# Patient Record
Sex: Male | Born: 1960 | Race: White | Hispanic: No | Marital: Single | Smoking: Never smoker
Health system: Southern US, Community
[De-identification: ages and names within clinical notes are randomized; demographics above are authoritative.]

## PROBLEM LIST (undated history)

## (undated) DIAGNOSIS — E7800 Pure hypercholesterolemia, unspecified: Secondary | ICD-10-CM

## (undated) DIAGNOSIS — I639 Cerebral infarction, unspecified: Secondary | ICD-10-CM

## (undated) DIAGNOSIS — E119 Type 2 diabetes mellitus without complications: Secondary | ICD-10-CM

## (undated) HISTORY — DX: Cerebral infarction, unspecified: I63.9

---

## 2009-04-14 ENCOUNTER — Ambulatory Visit: Payer: Self-pay | Admitting: Unknown Physician Specialty

## 2018-08-02 ENCOUNTER — Encounter (HOSPITAL_COMMUNITY): Payer: Self-pay | Admitting: Emergency Medicine

## 2018-08-02 ENCOUNTER — Other Ambulatory Visit: Payer: Self-pay

## 2018-08-02 ENCOUNTER — Emergency Department (HOSPITAL_COMMUNITY): Payer: Self-pay

## 2018-08-02 ENCOUNTER — Inpatient Hospital Stay (HOSPITAL_COMMUNITY)
Admission: EM | Admit: 2018-08-02 | Discharge: 2018-08-06 | DRG: 065 | Disposition: A | Discharge status: Home / Self Care | Payer: Self-pay | Attending: Family Medicine | Admitting: Family Medicine

## 2018-08-02 ENCOUNTER — Observation Stay (HOSPITAL_COMMUNITY): Payer: Self-pay

## 2018-08-02 DIAGNOSIS — I639 Cerebral infarction, unspecified: Secondary | ICD-10-CM

## 2018-08-02 DIAGNOSIS — Z794 Long term (current) use of insulin: Secondary | ICD-10-CM

## 2018-08-02 DIAGNOSIS — R29702 NIHSS score 2: Secondary | ICD-10-CM | POA: Diagnosis present

## 2018-08-02 DIAGNOSIS — E1165 Type 2 diabetes mellitus with hyperglycemia: Secondary | ICD-10-CM | POA: Diagnosis present

## 2018-08-02 DIAGNOSIS — R29810 Facial weakness: Secondary | ICD-10-CM | POA: Diagnosis present

## 2018-08-02 DIAGNOSIS — Z79899 Other long term (current) drug therapy: Secondary | ICD-10-CM

## 2018-08-02 DIAGNOSIS — G8191 Hemiplegia, unspecified affecting right dominant side: Secondary | ICD-10-CM | POA: Diagnosis present

## 2018-08-02 DIAGNOSIS — G6289 Other specified polyneuropathies: Secondary | ICD-10-CM

## 2018-08-02 DIAGNOSIS — I5189 Other ill-defined heart diseases: Secondary | ICD-10-CM

## 2018-08-02 DIAGNOSIS — R739 Hyperglycemia, unspecified: Secondary | ICD-10-CM

## 2018-08-02 DIAGNOSIS — E1142 Type 2 diabetes mellitus with diabetic polyneuropathy: Secondary | ICD-10-CM | POA: Diagnosis present

## 2018-08-02 DIAGNOSIS — E119 Type 2 diabetes mellitus without complications: Secondary | ICD-10-CM

## 2018-08-02 DIAGNOSIS — I10 Essential (primary) hypertension: Secondary | ICD-10-CM | POA: Diagnosis present

## 2018-08-02 DIAGNOSIS — Z23 Encounter for immunization: Secondary | ICD-10-CM

## 2018-08-02 DIAGNOSIS — E785 Hyperlipidemia, unspecified: Secondary | ICD-10-CM | POA: Diagnosis present

## 2018-08-02 DIAGNOSIS — E781 Pure hyperglyceridemia: Secondary | ICD-10-CM | POA: Diagnosis present

## 2018-08-02 DIAGNOSIS — I63312 Cerebral infarction due to thrombosis of left middle cerebral artery: Secondary | ICD-10-CM

## 2018-08-02 DIAGNOSIS — E7800 Pure hypercholesterolemia, unspecified: Secondary | ICD-10-CM | POA: Diagnosis present

## 2018-08-02 HISTORY — DX: Type 2 diabetes mellitus without complications: E11.9

## 2018-08-02 HISTORY — DX: Pure hypercholesterolemia, unspecified: E78.00

## 2018-08-02 LAB — COMPREHENSIVE METABOLIC PANEL
ALK PHOS: 96 U/L (ref 38–126)
ALT: 40 U/L (ref 0–44)
ANION GAP: 13 (ref 5–15)
AST: 23 U/L (ref 15–41)
Albumin: 3.9 g/dL (ref 3.5–5.0)
BUN: 28 mg/dL — ABNORMAL HIGH (ref 6–20)
CALCIUM: 9.8 mg/dL (ref 8.9–10.3)
CO2: 23 mmol/L (ref 22–32)
CREATININE: 0.84 mg/dL (ref 0.61–1.24)
Chloride: 95 mmol/L — ABNORMAL LOW (ref 98–111)
Glucose, Bld: 440 mg/dL — ABNORMAL HIGH (ref 70–99)
Potassium: 4.2 mmol/L (ref 3.5–5.1)
SODIUM: 131 mmol/L — AB (ref 135–145)
Total Bilirubin: 2 mg/dL — ABNORMAL HIGH (ref 0.3–1.2)
Total Protein: 6.9 g/dL (ref 6.5–8.1)

## 2018-08-02 LAB — GLUCOSE, CAPILLARY
GLUCOSE-CAPILLARY: 247 mg/dL — AB (ref 70–99)
GLUCOSE-CAPILLARY: 306 mg/dL — AB (ref 70–99)

## 2018-08-02 LAB — DIFFERENTIAL
Abs Immature Granulocytes: 0.1 10*3/uL (ref 0.0–0.1)
BASOS ABS: 0.1 10*3/uL (ref 0.0–0.1)
Basophils Relative: 1 %
EOS PCT: 2 %
Eosinophils Absolute: 0.2 10*3/uL (ref 0.0–0.7)
Immature Granulocytes: 1 %
LYMPHS PCT: 27 %
Lymphs Abs: 2.3 10*3/uL (ref 0.7–4.0)
MONO ABS: 0.6 10*3/uL (ref 0.1–1.0)
MONOS PCT: 7 %
NEUTROS ABS: 5.4 10*3/uL (ref 1.7–7.7)
Neutrophils Relative %: 62 %

## 2018-08-02 LAB — RAPID URINE DRUG SCREEN, HOSP PERFORMED
Amphetamines: NOT DETECTED
Barbiturates: NOT DETECTED
Benzodiazepines: NOT DETECTED
Cocaine: NOT DETECTED
OPIATES: NOT DETECTED
Tetrahydrocannabinol: NOT DETECTED

## 2018-08-02 LAB — PROTIME-INR
INR: 0.89
Prothrombin Time: 12 seconds (ref 11.4–15.2)

## 2018-08-02 LAB — I-STAT CHEM 8, ED
BUN: 32 mg/dL — ABNORMAL HIGH (ref 6–20)
CHLORIDE: 99 mmol/L (ref 98–111)
CREATININE: 0.8 mg/dL (ref 0.61–1.24)
Calcium, Ion: 1.16 mmol/L (ref 1.15–1.40)
GLUCOSE: 457 mg/dL — AB (ref 70–99)
HCT: 45 % (ref 39.0–52.0)
Hemoglobin: 15.3 g/dL (ref 13.0–17.0)
POTASSIUM: 4.2 mmol/L (ref 3.5–5.1)
Sodium: 130 mmol/L — ABNORMAL LOW (ref 135–145)
TCO2: 24 mmol/L (ref 22–32)

## 2018-08-02 LAB — I-STAT TROPONIN, ED: TROPONIN I, POC: 0.01 ng/mL (ref 0.00–0.08)

## 2018-08-02 LAB — APTT: APTT: 31 s (ref 24–36)

## 2018-08-02 LAB — URINALYSIS, ROUTINE W REFLEX MICROSCOPIC
BILIRUBIN URINE: NEGATIVE
Glucose, UA: >=500 mg/dL — AB
KETONES UR: 20 mg/dL — AB
LEUKOCYTES UA: NEGATIVE
NITRITE: NEGATIVE
PH: 6 (ref 5.0–8.0)
Protein, ur: 30 mg/dL — AB
Specific Gravity, Urine: 1.03 (ref 1.005–1.030)

## 2018-08-02 LAB — CBG MONITORING, ED: GLUCOSE-CAPILLARY: 439 mg/dL — AB (ref 70–99)

## 2018-08-02 LAB — CBC
HEMATOCRIT: 45.3 % (ref 39.0–52.0)
HEMOGLOBIN: 15.6 g/dL (ref 13.0–17.0)
MCH: 30.4 pg (ref 26.0–34.0)
MCHC: 34.4 g/dL (ref 30.0–36.0)
MCV: 88.1 fL (ref 78.0–100.0)
Platelets: 211 10*3/uL (ref 150–400)
RBC: 5.14 MIL/uL (ref 4.22–5.81)
RDW: 12.6 % (ref 11.5–15.5)
WBC: 8.6 10*3/uL (ref 4.0–10.5)

## 2018-08-02 LAB — ETHANOL: Alcohol, Ethyl (B): <10 mg/dL (ref <10)

## 2018-08-02 MED ORDER — ACETAMINOPHEN 160 MG/5ML PO SOLN: 650.0000 mg | ORAL | Status: DC | PRN

## 2018-08-02 MED ORDER — INSULIN ASPART 100 UNIT/ML ~~LOC~~ SOLN
0.0000 [IU] | Freq: Every day | SUBCUTANEOUS | Status: DC
  Administered 2018-08-02: 4 [IU] via SUBCUTANEOUS
  Administered 2018-08-03: 3 [IU] via SUBCUTANEOUS
  Administered 2018-08-05: 4 [IU] via SUBCUTANEOUS

## 2018-08-02 MED ORDER — ENOXAPARIN SODIUM 40 MG/0.4ML ~~LOC~~ SOLN
40.0000 mg | SUBCUTANEOUS | Status: DC
  Administered 2018-08-02 – 2018-08-05 (×4): 40 mg via SUBCUTANEOUS
  Filled 2018-08-02 (×4): qty 0.4

## 2018-08-02 MED ORDER — ASPIRIN 325 MG PO TABS
325.0000 mg | ORAL_TABLET | Freq: Every day | ORAL | Status: DC
  Administered 2018-08-02: 325 mg via ORAL
  Filled 2018-08-02: qty 1

## 2018-08-02 MED ORDER — INSULIN ASPART 100 UNIT/ML ~~LOC~~ SOLN
10.0000 [IU] | Freq: Once | SUBCUTANEOUS | Status: AC
  Administered 2018-08-02: 10 [IU] via SUBCUTANEOUS
  Filled 2018-08-02: qty 1

## 2018-08-02 MED ORDER — INSULIN GLARGINE 100 UNIT/ML ~~LOC~~ SOLN
10.0000 [IU] | Freq: Every day | SUBCUTANEOUS | Status: DC
  Administered 2018-08-02: 10 [IU] via SUBCUTANEOUS
  Filled 2018-08-02: qty 0.1

## 2018-08-02 MED ORDER — ACETAMINOPHEN 650 MG RE SUPP: 650.0000 mg | RECTAL | Status: DC | PRN

## 2018-08-02 MED ORDER — INSULIN ASPART 100 UNIT/ML ~~LOC~~ SOLN
0.0000 [IU] | Freq: Three times a day (TID) | SUBCUTANEOUS | Status: DC
  Administered 2018-08-02: 3 [IU] via SUBCUTANEOUS

## 2018-08-02 MED ORDER — STROKE: EARLY STAGES OF RECOVERY BOOK
Freq: Once | Status: AC
  Administered 2018-08-02: 19:00:00

## 2018-08-02 MED ORDER — IOPAMIDOL (ISOVUE-370) INJECTION 76%
INTRAVENOUS | Status: AC
  Filled 2018-08-02: qty 50

## 2018-08-02 MED ORDER — ATORVASTATIN CALCIUM 40 MG PO TABS
40.0000 mg | ORAL_TABLET | Freq: Every day | ORAL | Status: DC
  Administered 2018-08-02: 40 mg via ORAL
  Filled 2018-08-02: qty 1

## 2018-08-02 MED ORDER — INSULIN ASPART 100 UNIT/ML ~~LOC~~ SOLN
3.0000 [IU] | Freq: Three times a day (TID) | SUBCUTANEOUS | Status: DC
  Administered 2018-08-03 (×2): 3 [IU] via SUBCUTANEOUS

## 2018-08-02 MED ORDER — SENNOSIDES-DOCUSATE SODIUM 8.6-50 MG PO TABS: 1.0000 | ORAL_TABLET | Freq: Every evening | ORAL | Status: DC | PRN

## 2018-08-02 MED ORDER — CLOPIDOGREL BISULFATE 300 MG PO TABS
300.0000 mg | ORAL_TABLET | Freq: Once | ORAL | Status: AC
  Administered 2018-08-02: 300 mg via ORAL
  Filled 2018-08-02: qty 1

## 2018-08-02 MED ORDER — CLOPIDOGREL BISULFATE 75 MG PO TABS
75.0000 mg | ORAL_TABLET | Freq: Every day | ORAL | Status: DC
  Administered 2018-08-03 – 2018-08-06 (×4): 75 mg via ORAL
  Filled 2018-08-02 (×4): qty 1

## 2018-08-02 MED ORDER — ASPIRIN EC 81 MG PO TBEC
81.0000 mg | DELAYED_RELEASE_TABLET | Freq: Every day | ORAL | Status: DC
  Administered 2018-08-03 – 2018-08-06 (×4): 81 mg via ORAL
  Filled 2018-08-02 (×4): qty 1

## 2018-08-02 MED ORDER — IOPAMIDOL (ISOVUE-370) INJECTION 76%
100.0000 mL | Freq: Once | INTRAVENOUS | Status: AC | PRN
  Administered 2018-08-02: 100 mL via INTRAVENOUS

## 2018-08-02 MED ORDER — INSULIN ASPART 100 UNIT/ML ~~LOC~~ SOLN
0.0000 [IU] | Freq: Three times a day (TID) | SUBCUTANEOUS | Status: DC
  Administered 2018-08-03 (×2): 7 [IU] via SUBCUTANEOUS
  Administered 2018-08-03: 5 [IU] via SUBCUTANEOUS
  Administered 2018-08-04 (×2): 7 [IU] via SUBCUTANEOUS
  Administered 2018-08-04: 9 [IU] via SUBCUTANEOUS
  Administered 2018-08-05: 2 [IU] via SUBCUTANEOUS
  Administered 2018-08-05: 3 [IU] via SUBCUTANEOUS
  Administered 2018-08-05: 5 [IU] via SUBCUTANEOUS
  Administered 2018-08-06: 2 [IU] via SUBCUTANEOUS
  Administered 2018-08-06 (×2): 3 [IU] via SUBCUTANEOUS

## 2018-08-02 MED ORDER — HYDRALAZINE HCL 20 MG/ML IJ SOLN: 5.0000 mg | Freq: Four times a day (QID) | INTRAMUSCULAR | Status: DC | PRN

## 2018-08-02 MED ORDER — ACETAMINOPHEN 325 MG PO TABS: 650.0000 mg | ORAL_TABLET | ORAL | Status: DC | PRN

## 2018-08-02 NOTE — ED Triage Notes (Signed)
EMS- with patient from work with c/o difficulty picking up items with right hand and slurred speech this morning when he woke up around 0500. He reported to work around 0700 others noticed his changes. EMS was called at 11:49. Symptoms began to resolve with ems. Stroke team at the bridge.

## 2018-08-02 NOTE — H&P (Addendum)
Family Medicine Teaching [facility] Admission History and Physical Service Pager: [phone]  Patient name: Victor Moreno Medical record number: [id number] Date of birth: Jun 27, 1961 Age: 57 y.o. Gender: male  Primary Care Provider: [facility], Dr. Lacie Scotts Consultants: Neurology Code Status: FULL  Chief Complaint: Slurred speech  Assessment and Plan: Victor Moreno is a right-handed 57 y.o. male presenting with slurred speech, right-sided weakness. PMH is significant for T2DM, HLD.  Patient denies any other medical problems.  His primary care office has been contacted and they are to fax over his recent medical records for med rec and confirmation of problem list.  These papers will be faxed to the nurses station on 3 W.  CVA Patient does not have any gross neurological deficit on exam on floor with the exception of left hand rapid movement, which he reports is likely baseline for him, as he is right handed.   Admit to telemetry, attending Dr. Leveda Anna  Neurology recommendations for stroke protocol  Start dual antiplatelet therapy with Plavix 75 mg, 325 mg aspirin, s/p plavix 300mg  in ED  A.m. lipid panel, and an A1c  Neuro checks q4  ECHO  Carotid dopplers  PT/OT/SLP to eval and treat  Regular diet as patient passed swallow study  MRI and MRA head and neck pending  Hypertension Patient has never been diagnosed with hypertension.  However while emergency room, his systolic is 201 but is currently otherwise asymptomatic. 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   We will continue to monitor blood pressures and add medication if needed.Marland Kitchen  T2DM:  Takes 10-15U insulin in the morning, metformin and another medication that the patient cannot remember, (maybe Lazi?). Takes his medications most mornings, misses maybe 2 days per week. Does not measure blood sugar at home.    Patient received 10 units of NovoLog in the ED for elevated blood glucose.  We  will place patient on sensitive sliding scale 3 times daily with meals.   Will approximate insulin needs with tomorrow's glucose values if records do not come through.  HLD: Takes pravastatin 40mg  daily.   Will start Lipitor 40 mg patient  Peripheral Neuropathy: Since current disease is in his feet.  He states that when he gets bad it feels like he is wearing shoes 2 sizes too small.  Today it is stable in physical exam.    Patient does not take any medication for peripheral neuropathy.   F: none E: replete PRN N: PO  Regular diet  Prophylaxis: Lovenox  Disposition: Pending patient's acute clinical recovery  History of Present Illness:  Victor Moreno is a 57 y.o. male presenting with stroke-like symptoms.  Patient woke up at 5 am and lost his balance when getting out of bed. That is not unusual for him because he has neuropathy. Before getting into work, he was writing a check and found it hard to write in cursive, however was able to complete writing the check. While driving to work, he noticed no differences. He arrived to work around 7 am and typically works by himself in the mornings.  It was not until 1015 that coworkers reported to him that he was slurring his speech.  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  He has never had a stroke before.He did not take any of his medications this morning.  Mom and brother are at bedside.  He arrived by EMS at 1236 and on arrival his NIH  score was 2.  He had no extremity weakness on arrival.  He received CT angios head and neck and a CT noncontrast.  His sugar was 440 and was given 10 units of insulin and 300 mg Plavix.  Neurology was consulted and they requested MRIs.  Review Of Systems: Per HPI with the following additions:   Review of Systems  Constitutional: Negative for chills and fever.  HENT: Negative for congestion.   Eyes: Negative for blurred vision and double vision.  Respiratory:  Negative for shortness of breath.   Cardiovascular: Negative for chest pain.  Gastrointestinal: Negative for constipation, diarrhea, nausea and vomiting.  Genitourinary: Negative for dysuria.  Musculoskeletal: Negative for falls.  Neurological: Positive for speech change, focal weakness and weakness. Negative for headaches.    Patient Active Problem List   Diagnosis Date Noted  . CVA (cerebral vascular accident) (HCC) 08/02/2018    Past Medical History: Past Medical History:  Diagnosis Date  . Diabetes mellitus without complication (HCC)   . 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  Past Surgical History: History reviewed. No pertinent surgical history.  Social History: Social History   Tobacco Use  . Smoking status: Never Smoker  . Smokeless tobacco: Never Used  Substance Use Topics  . Alcohol use: Not Currently  . Drug use: Never  Additional social history: He is worked at [employer] for the last 2 months.  He lives alone at home.  In his return he watches TV, does yard work, enjoys Museum/gallery curator music, and working on his computer.  Never smoker, only drinks a few beers a year, does not use illicit drugs   Family History: Mother had high cholesterol, hypothyroidism Brother with hypertension, HLD and hypothyroidism Dad passed away at your 35 with prostate cancer.  Died of complications from bleeding.  Allergies and Medications: Allergies  Allergen Reactions  . Bee Venom Anaphylaxis   No current facility-administered medications on file prior to encounter.    No current outpatient medications on file prior to encounter.    Objective: BP (!) 147/82   Pulse 77   Temp 97.9 F (36.6 C)   Resp 20   Ht 5' 9.25" (1.759 m)   Wt 86.2 kg   SpO2 96%   BMI 27.86 kg/m  Exam: Gen: NAD, alert, non-toxic, well-nourished, well-appearing, pleasant. No facial droop appreciated. HEENT: Normocephaic, atraumatic. PERRLA, clear conjuctiva, no  scleral icterus and injection. Normal EOM.  Hearing intact.  Neck supple with no LAD, nodules, or gross abnormality.  Nares patent with no discharge.    Oropharynx without erythema and lesions.  Tonsils nonswollen and without exudate.   CV: 2/6 diastolic murmur.  Normal S1-S2.  No murmur, gallops, S3, S4 appreciated.  Normal capillary refill bilaterally.  Radial pulses 2+ bilaterally. No bilateral lower extremity edema. Resp: Clear to auscultation bilaterally.  No wheezing, rales, rhonchi, or other abnormal lung sounds.  No increased work of breathing appreciated. Abd: Nontender and nondistended on palpation to all 4 quadrants.  Positive bowel sounds. Skin: No obvious rashes, lesions, or trauma.  Normal turgor.  MSK: Normal ROM. Normal strength and tone.  Extremities: Right 1st toenail is black with ridged toenail. No sings of infection. Remainder to feet appear normal bilaterally. Psych: Cooperative with exam.  Normal speech. Pleasant. Makes good eye contact. Neurologic examination: Level of Consciousness: Awake Mental status:  Cognition:The patient is alert, attentive, and oriented x4.  Speech is clear and fluent with good repetition, comprehension,  and naming.  Cranial nerves: CN II: Visual fields are full to confrontation.  CN III, IV, VI: At primary gaze, there is no eye deviation. No ptosis CN V: Facial sensation is intact to pinprick in all 3 divisions bilaterally. Corneal responses are intact. CN VII: Face is symmetric with normal eye closure and smile. CN VII: Hearing is normal to rubbing fingers CN IX, X: Palate elevates symmetrically.  CN XI: Head turning and shoulder shrug are intact CN XII: Tongue is midline with normal movements and no atrophy. Motor: There is no pronator drift of out-stretched arms. Muscle bulk and tone are normal. Strength is 5/5 in all major muscle groups bilaterally. Deltoid, biceps, triceps, wrist extension, finger abduction, hip flexion, hip extension,  knee flexion, knee extension, ankle flexion, ankle extension. Sensory: Grossly intact with the exception of bilateral feet.  Coordination: Rapid alternating movements intact; however left less so than right. There is no dysmetria on  heel-knee-shin. There are no abnormal or extraneous movements.  Gait/Stance: No assessed.  Labs and Imaging: CBC BMET  Recent Labs  Lab 08/02/18 1241 08/02/18 1243  WBC 8.6  --   HGB 15.6 15.3  HCT 45.3 45.0  PLT 211  --    Recent Labs  Lab 08/02/18 1241 08/02/18 1243  NA 131* 130*  K 4.2 4.2  CL 95* 99  CO2 23  --   BUN 28* 32*  CREATININE 0.84 0.80  GLUCOSE 440* 457*  CALCIUM 9.8  --      PT 12.0, PTT 31, INR 0.89.  T bili 2.0, troponins 0.011.  Remainder of CMP within normal limits.  Melene Plan, MD 08/02/2018, 3:09 PM PGY-1, [facility] Family Medicine FPTS Intern pager: [phone], text pages welcome  FPTS Upper-Level Resident Addendum   I have independently interviewed and examined the patient. I have discussed the above with the original author and agree with their documentation. My edits for correction/addition/clarification are in purple. Please see also any attending notes.    [place] Chrystopher Stangl, DO PGY-2, [facility] Family Medicine 08/02/2018 7:44 PM  FPTS Service pager: [phone] (text pages welcome through [facility])

## 2018-08-02 NOTE — ED Provider Notes (Signed)
[name] EMERGENCY DEPARTMENT Provider Note   CSN: [id number] Arrival date & time: 08/02/18  1236   An emergency department physician performed an initial assessment on this suspected stroke patient at 1243(Kyndal Gloster).  History   Chief Complaint Chief Complaint  Patient presents with  . Code Stroke    HPI Victor Moreno is a 57 y.o. male hx of DM, HL, here with possible stroke.  Patient states that he woke up around 5 AM was feeling fine.  He went to work around 7 AM and he noticed slurred speech as well as right arm weakness.  He states that he has trouble picking up things with the right arm.  Code stroke was activated by EMS.  Patient states that his symptoms has begun improving. No previous hx of stroke. He has hx of DM and is on oral meds.   The history is provided by the patient.    Past Medical History:  Diagnosis Date  . Diabetes mellitus without complication (HCC)   . High cholesterol     Patient Active Problem List   Diagnosis Date Noted  . CVA (cerebral vascular accident) (HCC) 08/02/2018    History reviewed. No pertinent surgical history.      Home Medications    Prior to Admission medications   Not on File    Family History No family history on file.  Social History Social History   Tobacco Use  . Smoking status: Never Smoker  . Smokeless tobacco: Never Used  Substance Use Topics  . Alcohol use: Not Currently  . Drug use: Never     Allergies   Patient has no known allergies.   Review of Systems Review of Systems  Neurological: Positive for speech difficulty and weakness.  All other systems reviewed and are negative.    Physical Exam Updated Vital Signs BP (!) 175/94   Pulse 79   Temp 97.9 F (36.6 C)   Resp 19   Ht 5' 9.25" (1.759 m)   Wt 86.2 kg   SpO2 96%   BMI 27.86 kg/m   Physical Exam  Constitutional: He is oriented to person, place, and time. He appears well-developed.  HENT:  Head: Normocephalic.   Eyes: Pupils are equal, round, and reactive to light. Conjunctivae and EOM are normal.  Neck: Normal range of motion.  Cardiovascular: Normal rate.  Pulmonary/Chest: Effort normal.  Abdominal: Soft.  Musculoskeletal: Normal range of motion.  Neurological: He is alert and oriented to person, place, and time.  Mild slurred speech. Mild R facial droop. Strength 5/5 throughout, no pronator drift   Skin: Skin is warm.  Psychiatric: He has a normal mood and affect.  Nursing note and vitals reviewed.    ED Treatments / Results  Labs (all labs ordered are listed, but only abnormal results are displayed) Labs Reviewed  COMPREHENSIVE METABOLIC PANEL - Abnormal; Notable for the following components:      Result Value   Sodium 131 (*)    Chloride 95 (*)    Glucose, Bld 440 (*)    BUN 28 (*)    Total Bilirubin 2.0 (*)    All other components within normal limits  CBG MONITORING, ED - Abnormal; Notable for the following components:   Glucose-Capillary 439 (*)    All other components within normal limits  I-STAT CHEM 8, ED - Abnormal; Notable for the following components:   Sodium 130 (*)    BUN 32 (*)    Glucose, Bld 457 (*)  All other components within normal limits  ETHANOL  PROTIME-INR  APTT  CBC  DIFFERENTIAL  RAPID URINE DRUG SCREEN, HOSP PERFORMED  URINALYSIS, ROUTINE W REFLEX MICROSCOPIC  I-STAT TROPONIN, ED    EKG None  Radiology Ct Angio Head W Or Wo Contrast  Result Date: 08/02/2018 CLINICAL DATA:  Right-sided weakness, slurred speech EXAM: CT ANGIOGRAPHY HEAD AND NECK TECHNIQUE: Multidetector CT imaging of the head and neck was performed using the standard protocol during bolus administration of intravenous contrast. Multiplanar CT image reconstructions and MIPs were obtained to evaluate the vascular anatomy. Carotid stenosis measurements (when applicable) are obtained utilizing NASCET criteria, using the distal internal carotid diameter as the denominator. CONTRAST:   ISOVUE-370 IOPAMIDOL (ISOVUE-370) INJECTION 76% COMPARISON:  CT head 08/02/2018 FINDINGS: CTA NECK FINDINGS Aortic arch: Standard branching. Imaged portion shows no evidence of aneurysm or dissection. No significant stenosis of the major arch vessel origins. Right carotid system: Mild atherosclerotic disease at the right carotid bifurcation without significant stenosis. Left carotid system: Atherosclerotic disease left carotid bifurcation and proximal left internal carotid artery with minimal luminal diameter 3.5 mm, similar to the normal diameter without significant stenosis. Vertebral arteries: Moderate stenosis origin of the vertebral artery bilaterally. Both vertebral arteries are patent to the basilar. Skeleton: Cervical spondylosis.  No acute bony abnormality. Other neck: Negative for mass or adenopathy. Upper chest: Negative Review of the MIP images confirms the above findings CTA HEAD FINDINGS Anterior circulation: Mild atherosclerotic calcification in the cavernous carotid bilaterally without stenosis. Proximal anterior and middle cerebral arteries widely patent bilaterally. Mild to moderate stenosis distal anterior cerebral arteries bilaterally. No significant middle cerebral artery stenosis. Posterior circulation: Mild calcific stenosis left vertebral artery at the level of the dura. Right vertebral artery widely patent. Both vertebral arteries contribute to the basilar. PICA patent bilaterally. Superior cerebellar and posterior cerebral arteries patent. Moderate stenosis P2 segment bilaterally. Venous sinuses: Patent Anatomic variants: None Delayed phase: Not performed Review of the MIP images confirms the above findings IMPRESSION: 1. Negative for emergent large vessel occlusion 2. Moderate stenosis origin of the vertebral arteries bilaterally. Atherosclerotic disease of the carotid bifurcation left greater than right without hemodynamically significant stenosis 3. Mild to moderate stenosis  distal ACA bilaterally. Moderate stenosis P2 segment bilaterally. Electronically Signed   By: Marlan Palau M.D.   On: 08/02/2018 13:16   Ct Angio Neck W Or Wo Contrast  Result Date: 08/02/2018 CLINICAL DATA:  Right-sided weakness, slurred speech EXAM: CT ANGIOGRAPHY HEAD AND NECK TECHNIQUE: Multidetector CT imaging of the head and neck was performed using the standard protocol during bolus administration of intravenous contrast. Multiplanar CT image reconstructions and MIPs were obtained to evaluate the vascular anatomy. Carotid stenosis measurements (when applicable) are obtained utilizing NASCET criteria, using the distal internal carotid diameter as the denominator. CONTRAST:  ISOVUE-370 IOPAMIDOL (ISOVUE-370) INJECTION 76% COMPARISON:  CT head 08/02/2018 FINDINGS: CTA NECK FINDINGS Aortic arch: Standard branching. Imaged portion shows no evidence of aneurysm or dissection. No significant stenosis of the major arch vessel origins. Right carotid system: Mild atherosclerotic disease at the right carotid bifurcation without significant stenosis. Left carotid system: Atherosclerotic disease left carotid bifurcation and proximal left internal carotid artery with minimal luminal diameter 3.5 mm, similar to the normal diameter without significant stenosis. Vertebral arteries: Moderate stenosis origin of the vertebral artery bilaterally. Both vertebral arteries are patent to the basilar. Skeleton: Cervical spondylosis.  No acute bony abnormality. Other neck: Negative for mass or adenopathy. Upper chest: Negative Review of  the MIP images confirms the above findings CTA HEAD FINDINGS Anterior circulation: Mild atherosclerotic calcification in the cavernous carotid bilaterally without stenosis. Proximal anterior and middle cerebral arteries widely patent bilaterally. Mild to moderate stenosis distal anterior cerebral arteries bilaterally. No significant middle cerebral artery stenosis. Posterior circulation: Mild  calcific stenosis left vertebral artery at the level of the dura. Right vertebral artery widely patent. Both vertebral arteries contribute to the basilar. PICA patent bilaterally. Superior cerebellar and posterior cerebral arteries patent. Moderate stenosis P2 segment bilaterally. Venous sinuses: Patent Anatomic variants: None Delayed phase: Not performed Review of the MIP images confirms the above findings IMPRESSION: 1. Negative for emergent large vessel occlusion 2. Moderate stenosis origin of the vertebral arteries bilaterally. Atherosclerotic disease of the carotid bifurcation left greater than right without hemodynamically significant stenosis 3. Mild to moderate stenosis distal ACA bilaterally. Moderate stenosis P2 segment bilaterally. Electronically Signed   By: Marlan Palau M.D.   On: 08/02/2018 13:16   Ct Head Code Stroke Wo Contrast  Result Date: 08/02/2018 CLINICAL DATA:  Code stroke. Speech difficulty. Right-sided weakness. EXAM: CT HEAD WITHOUT CONTRAST TECHNIQUE: Contiguous axial images were obtained from the base of the skull through the vertex without intravenous contrast. COMPARISON:  None. FINDINGS: Brain: Small hypodensity right frontal white matter consistent with chronic ischemia. Negative for acute infarct, hemorrhage, mass. Ventricle size normal. Vascular: Atherosclerotic calcification of the skull base. Negative for hyperdense vessel Skull: Negative Sinuses/Orbits: Paranasal sinuses clear. Cataract surgery on the right. No orbital mass. Other: None ASPECTS (Alberta Stroke Program Early CT Score) - Ganglionic level infarction (caudate, lentiform nuclei, internal capsule, insula, M1-M3 cortex): 7 - Supraganglionic infarction (M4-M6 cortex): 3 Total score (0-10 with 10 being normal): 10 IMPRESSION: 1. No acute abnormality 2. ASPECTS is 10 3. These results were called by telephone at the time of interpretation on 08/02/2018 at 12:55 pm to Dr. Amada Jupiter, who verbally acknowledged these  results. Electronically Signed   By: Marlan Palau M.D.   On: 08/02/2018 12:55    Procedures Procedures (including critical care time)  Medications Ordered in ED Medications  iopamidol (ISOVUE-370) 76 % injection (has no administration in time range)  clopidogrel (PLAVIX) tablet 75 mg (has no administration in time range)  insulin aspart (novoLOG) injection 10 Units (10 Units Subcutaneous Given 08/02/18 1312)  iopamidol (ISOVUE-370) 76 % injection 100 mL (100 mLs Intravenous Contrast Given 08/02/18 1300)  clopidogrel (PLAVIX) tablet 300 mg (300 mg Oral Given 08/02/18 1407)     Initial Impression / Assessment and Plan / ED Course  I have reviewed the triage vital signs and the nursing notes.  Pertinent labs & imaging results that were available during my care of the patient were reviewed by me and considered in my medical decision making (see chart for details).    Victor Moreno is a 57 y.o. male here with code stroke. Last normal was 7 am. Code stroke activated by EMS. Dr. Amada Jupiter at bedside. NIH was 2 on initial assessment. No TPA currently. Plan to get labs, CT head, CTA head/neck   2:17 PM CTA showed no obvious LVO. Labs showed glucose 450. Given 10 U SQ insulin. He follows up at [facility] practice. Family practice to admit for stroke workup, uncontrolled diabetes.    Final Clinical Impressions(s) / ED Diagnoses   Final diagnoses:  None    ED Discharge Orders    None       Charlynne Pander, MD 08/02/18 [phone]

## 2018-08-02 NOTE — Consult Note (Signed)
Neurology Consultation Reason for Consult: Right-sided weakness Referring Physician: Silverio Lay, D  CC: Right-sided weakness  History is obtained from: Patient  HPI: Victor Moreno is a 57 y.o. male with a history of high cholesterol and diabetes who awoke this morning at 5 AM in his normal state of health.  At 7 AM he began noticing right-sided weakness.  He was having trouble picking things up, and was dropping things with his right hand.  Then people began noticing his symptoms, and around noon EMS was finally called.   En route, his symptoms began improving, but he continued to have mild right-sided weakness even after arrival.   LKW: 7 AM tpa given?: no, outside of window Premorbid modified rankin scale: 0 NIHSS: 2   ROS: A 14 point ROS was performed and is negative except as noted in the HPI.   Past Medical History:  Diagnosis Date  . Diabetes mellitus without complication (HCC)   . High cholesterol      Family history: Grandfather-stroke   Social History: Denies tobacco, alcohol or drug use  Exam: Current vital signs: SpO2 90%  Vital signs in last 24 hours: SpO2:  [90 %] 90 % (09/06 1240)   Physical Exam  Constitutional: Appears well-developed and well-nourished.  Psych: Affect appropriate to situation Eyes: No scleral injection HENT: No OP obstrucion Head: Normocephalic.  Cardiovascular: Normal rate and regular rhythm.  Respiratory: Effort normal, non-labored breathing GI: Soft.  No distension. There is no tenderness.  Skin: WDI  Neuro: Mental Status: Patient is awake, alert, oriented to person, place, month, year, and situation. Patient is able to give a clear and coherent history. No signs of aphasia or neglect Cranial Nerves: II: Visual Fields are full. Pupils are equal, round, and reactive to light.   III,IV, VI: EOMI without ptosis or diploplia.  V: Facial sensation is symmetric to temperature VII: Facial movement is symmetric.  VIII: hearing is  intact to voice X: Uvula elevates symmetrically XI: Shoulder shrug is symmetric. XII: tongue is midline without atrophy or fasciculations.  Motor: Tone is normal. Bulk is normal. 5/5 strength was present on the left side, he has mild 4+/5 weakness of the right arm and leg without drift Sensory: Sensation is symmetric to light touch and temperature in the arms and legs. Cerebellar: He has mild difficulty in finger-nose-finger and heel-knee-shin on the right which I suspect is consistent with his weakness as opposed to a very mild ataxia, normal in the left     I have reviewed labs in epic and the results pertinent to this consultation are: Elevated glucose  I have reviewed the images obtained: CT/CTA-no LVO, no clear acute findings  Impression: 57 year old male with acute onset right-sided weakness consistent with a small subcortical infarct.  I suspect that this is due to small vessel disease due to his high cholesterol and diabetes.  He will need to be admitted for physical therapy and secondary risk factor modification.  Recommendations: - HgbA1c, fasting lipid panel - MRI, MRA  of the brain without contrast - Frequent neuro checks - Echocardiogram - Carotid dopplers - Prophylactic therapy-Antiplatelet med: Aspirin - dose 325mg  PO or 300mg  PR and Plavix 75 mg daily following 300 mg - Risk factor modification - Telemetry monitoring - PT consult, OT consult, Speech consult - Stroke team to follow  Ritta Slot, MD [facility] [phone]  If 7pm- 7am, please page neurology on call as listed in AMION.

## 2018-08-02 NOTE — Evaluation (Signed)
Physical Therapy Evaluation Patient Details Name: Victor Moreno MRN: [id number] DOB: November 30, 1960 Today's Date: 08/02/2018   History of Present Illness  Victor Moreno is a right-handed 57 y.o. male presenting with slurred speech, right-sided weakness. PMH is significant for T2DM, HLD. MRI pending at time of PT evaluation.     Clinical Impression  Patient evaluated by Physical Therapy with no further acute PT needs identified. All education has been completed and the patient has no further questions. 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 Upon eval pt overall mobilizing near baseline however reports balance somewhere worse today. Mild R sided deficits noted. Feel he would benefit from OP neuro PT for higher level balance training and fine motor skills of R hand. Ambulating unit without assistive device, DGI score 17/24.  See below for any follow-up Physical Therapy or equipment needs. PT is signing off. Thank you for this referral.      Follow Up Recommendations Outpatient PT Outpatient NEURO     Equipment Recommendations  None recommended by PT    Recommendations for Other Services       Precautions / Restrictions        Mobility  Bed Mobility Overal bed mobility: Independent                Transfers Overall transfer level: Independent                  Ambulation/Gait Ambulation/Gait assistance: Modified independent (Device/Increase time) Gait Distance (Feet): 700 Feet Assistive device: None Gait Pattern/deviations: Step-to pattern;Step-through pattern Gait velocity: decrease   General Gait Details: pt with baseline limp however reports it is slightly worse. mild unsteadiness noted but no overt LOB or neED dme   Stairs            Wheelchair Mobility    Modified Rankin (Stroke Patients Only) Modified Rankin (Stroke Patients Only) Pre-Morbid Rankin  Score: No significant disability Modified Rankin: Slight disability     Balance Overall balance assessment: (No need for support but balance deficits noted)                               Standardized Balance Assessment Standardized Balance Assessment : Dynamic Gait Index   Dynamic Gait Index Level Surface: Mild Impairment Change in Gait Speed: Mild Impairment Gait with Horizontal Head Turns: Mild Impairment Gait with Vertical Head Turns: Normal Gait and Pivot Turn: Mild Impairment Step Over Obstacle: Mild Impairment Step Around Obstacles: Mild Impairment Steps: Mild Impairment Total Score: 17       Pertinent Vitals/Pain Pain Assessment: No/denies pain    Home Living Family/patient expects to be discharged to:: Private residence Living Arrangements: Alone Available Help at Discharge: Family;Available PRN/intermittently(mom and brother)                  Prior Function Level of Independence: Independent(Works, drives)               Hand Dominance        Extremity/Trunk Assessment   Upper Extremity Assessment Upper Extremity Assessment: Defer to OT evaluation    Lower Extremity Assessment Lower Extremity Assessment: Overall WFL for tasks assessed(hx of neuropathy BLE. R sided weakness 4/5 gross. )    Cervical / Trunk Assessment Cervical / Trunk Assessment: Normal  Communication   Communication: No difficulties  Cognition Arousal/Alertness: Awake/alert Behavior During  Therapy: WFL for tasks assessed/performed Overall Cognitive Status: Within Functional Limits for tasks assessed                                        General Comments      Exercises     Assessment/Plan    PT Assessment All further PT needs can be met in the next venue of care  PT Problem List Decreased strength;Decreased range of motion;Decreased coordination       PT Treatment Interventions      PT Goals (Current goals can be found in the  Care Plan section)  Acute Rehab PT Goals Patient Stated Goal: return to live at moms [facility]fre getting back to work PT Goal Formulation: With patient Time For Goal Achievement: 08/16/18 Potential to Achieve Goals: Good    Frequency     Barriers to discharge        Co-evaluation               AM-PAC PT "6 Clicks" Daily Activity  Outcome Measure Difficulty turning over in bed (including adjusting bedclothes, sheets and blankets)?: None Difficulty moving from lying on back to sitting on the side of the bed? : None Difficulty sitting down on and standing up from a chair with arms (e.g., wheelchair, bedside commode, etc,.)?: None Help needed moving to and from a bed to chair (including a wheelchair)?: None Help needed walking in hospital room?: None Help needed climbing 3-5 steps with a railing? : None 6 Click Score: 24    End of Session Equipment Utilized During Treatment: Gait belt Activity Tolerance: Patient tolerated treatment well     PT Visit Diagnosis: Unsteadiness on feet (R26.81)    Time: 1715-1735 PT Time Calculation (min) (ACUTE ONLY): 20 min   Charges:   PT Evaluation $PT Eval Moderate Complexity: 1 Mod          Reinaldo Berber, PT, DPT Acute Rehabilitation Services Pager: [phone] Office: [phone]    Reinaldo Berber 08/02/2018, 6:01 PM

## 2018-08-02 NOTE — Progress Notes (Signed)
Bilateral carotid duplex completed - Preliminary results - 1% to 39% ICA stenosis. Vertebral artery flow is antegrade. [employer], RVS 08/02/2018 4:10 PM

## 2018-08-02 NOTE — Code Documentation (Signed)
57yo male arriving to [facility] via [facility] at 1236. Patient from work where he started noticing RUE weakness and slurred speech around 0700. Patient reportedly woke up at 0500 at his baseline. Patient's coworkers also noted symptoms after his arrival to work and called EMS. EMS activated a code stroke for right hand weakness and slurred speech. Patient noted to be hypertensive at 230/110 per EMS. Stroke team at the bedside on patient arrival. Labs drawn and patient to CT with team. CT completed followed by CTA. NIHSS 2, see documentation for details and code stroke times. Patient with mild right facial droop and mild dysarthria. Patient with mild weakness on the right side on exam, however, no drift. Patient is outside the window for treatment with tPA. No acute stroke treatment at this time. Bedside handoff with ED RN Merry Proud.

## 2018-08-02 NOTE — ED Notes (Signed)
ED Provider at bedside. 

## 2018-08-03 ENCOUNTER — Observation Stay (HOSPITAL_COMMUNITY): Payer: Self-pay

## 2018-08-03 ENCOUNTER — Observation Stay (HOSPITAL_BASED_OUTPATIENT_CLINIC_OR_DEPARTMENT_OTHER): Payer: Self-pay

## 2018-08-03 ENCOUNTER — Other Ambulatory Visit (HOSPITAL_COMMUNITY): Payer: Self-pay

## 2018-08-03 DIAGNOSIS — E782 Mixed hyperlipidemia: Secondary | ICD-10-CM

## 2018-08-03 DIAGNOSIS — E1165 Type 2 diabetes mellitus with hyperglycemia: Secondary | ICD-10-CM

## 2018-08-03 DIAGNOSIS — I361 Nonrheumatic tricuspid (valve) insufficiency: Secondary | ICD-10-CM

## 2018-08-03 LAB — LIPID PANEL
Cholesterol: 372 mg/dL — ABNORMAL HIGH (ref 0–200)
LDL CALC: UNDETERMINED mg/dL (ref 0–99)
TRIGLYCERIDES: 1356 mg/dL — AB (ref <150)
VLDL: UNDETERMINED mg/dL (ref 0–40)

## 2018-08-03 LAB — ECHOCARDIOGRAM COMPLETE
Height: 69.25 in
Weight: 3040 oz

## 2018-08-03 LAB — GLUCOSE, CAPILLARY
GLUCOSE-CAPILLARY: 269 mg/dL — AB (ref 70–99)
GLUCOSE-CAPILLARY: 296 mg/dL — AB (ref 70–99)
GLUCOSE-CAPILLARY: 256 mg/dL — AB (ref 70–99)
Glucose-Capillary: 303 mg/dL — ABNORMAL HIGH (ref 70–99)
Glucose-Capillary: 319 mg/dL — ABNORMAL HIGH (ref 70–99)

## 2018-08-03 LAB — HEMOGLOBIN A1C
Hgb A1c MFr Bld: 12.3 % — ABNORMAL HIGH (ref 4.8–5.6)
Mean Plasma Glucose: 306.31 mg/dL

## 2018-08-03 LAB — HIV ANTIBODY (ROUTINE TESTING W REFLEX): HIV SCREEN 4TH GENERATION: NONREACTIVE

## 2018-08-03 MED ORDER — PNEUMOCOCCAL VAC POLYVALENT 25 MCG/0.5ML IJ INJ
0.5000 mL | INJECTION | INTRAMUSCULAR | Status: AC
  Administered 2018-08-04: 0.5 mL via INTRAMUSCULAR
  Filled 2018-08-03: qty 0.5

## 2018-08-03 MED ORDER — INFLUENZA VAC SPLIT QUAD 0.5 ML IM SUSY
0.5000 mL | PREFILLED_SYRINGE | INTRAMUSCULAR | Status: AC
  Administered 2018-08-04: 0.5 mL via INTRAMUSCULAR
  Filled 2018-08-03: qty 0.5

## 2018-08-03 MED ORDER — FENOFIBRATE 54 MG PO TABS
54.0000 mg | ORAL_TABLET | Freq: Every day | ORAL | Status: DC
  Administered 2018-08-03: 54 mg via ORAL
  Filled 2018-08-03: qty 1

## 2018-08-03 MED ORDER — INSULIN ASPART 100 UNIT/ML ~~LOC~~ SOLN
5.0000 [IU] | Freq: Three times a day (TID) | SUBCUTANEOUS | Status: DC
  Administered 2018-08-03 – 2018-08-04 (×2): 5 [IU] via SUBCUTANEOUS

## 2018-08-03 MED ORDER — INSULIN GLARGINE 100 UNIT/ML ~~LOC~~ SOLN
16.0000 [IU] | Freq: Every day | SUBCUTANEOUS | Status: DC
  Administered 2018-08-03: 16 [IU] via SUBCUTANEOUS
  Filled 2018-08-03: qty 0.16

## 2018-08-03 MED ORDER — FENOFIBRATE 160 MG PO TABS
160.0000 mg | ORAL_TABLET | Freq: Every day | ORAL | Status: DC
  Administered 2018-08-04 – 2018-08-06 (×3): 160 mg via ORAL
  Filled 2018-08-03 (×3): qty 1

## 2018-08-03 MED ORDER — ROSUVASTATIN CALCIUM 20 MG PO TABS
20.0000 mg | ORAL_TABLET | Freq: Every day | ORAL | Status: DC
  Administered 2018-08-03 – 2018-08-06 (×4): 20 mg via ORAL
  Filled 2018-08-03 (×4): qty 1

## 2018-08-03 MED ORDER — INSULIN GLARGINE 100 UNIT/ML ~~LOC~~ SOLN
13.0000 [IU] | Freq: Every day | SUBCUTANEOUS | Status: DC
  Filled 2018-08-03: qty 0.13

## 2018-08-03 MED ORDER — INSULIN GLARGINE 100 UNIT/ML ~~LOC~~ SOLN
3.0000 [IU] | Freq: Once | SUBCUTANEOUS | Status: AC
  Administered 2018-08-03: 3 [IU] via SUBCUTANEOUS
  Filled 2018-08-03: qty 0.03

## 2018-08-03 NOTE — Progress Notes (Signed)
  Echocardiogram 2D Echocardiogram has been performed.  Victor Moreno F 08/03/2018, 12:35 PM

## 2018-08-03 NOTE — Progress Notes (Signed)
STROKE TEAM PROGRESS NOTE   SUBJECTIVE (INTERVAL HISTORY) No family is at the bedside.  He is sitting in chair with right facial droop and right hemiparesis. His A1C 12.3 and his TG 1356. BP 160/90 at bedside.    OBJECTIVE Vitals:   08/03/18 0700 08/03/18 0705 08/03/18 0821 08/03/18 0915  BP:  (!) 151/93 (!) 187/102 (!) 141/85  Pulse:  75    Resp: (!) 21 20    Temp:   98.4 F (36.9 C)   TempSrc:   Oral   SpO2: (!) 87% 94%    Weight:      Height:        CBC:  Recent Labs  Lab 08/02/18 1241 08/02/18 1243  WBC 8.6  --   NEUTROABS 5.4  --   HGB 15.6 15.3  HCT 45.3 45.0  MCV 88.1  --   PLT 211  --     Basic Metabolic Panel:  Recent Labs  Lab 08/02/18 1241 08/02/18 1243  NA 131* 130*  K 4.2 4.2  CL 95* 99  CO2 23  --   GLUCOSE 440* 457*  BUN 28* 32*  CREATININE 0.84 0.80  CALCIUM 9.8  --     Lipid Panel:     Component Value Date/Time   CHOL 372 (H) 08/03/2018 0459   TRIG 1,356 (H) 08/03/2018 0459   HDL NOT REPORTED DUE TO HIGH TRIGLYCERIDES 08/03/2018 0459   CHOLHDL NOT REPORTED DUE TO HIGH TRIGLYCERIDES 08/03/2018 0459   VLDL UNABLE TO CALCULATE IF TRIGLYCERIDE OVER 400 mg/dL 16/08/9603 5409   LDLCALC UNABLE TO CALCULATE IF TRIGLYCERIDE OVER 400 mg/dL 81/19/1478 2956   OZHY8M:  Lab Results  Component Value Date   HGBA1C 12.3 (H) 08/03/2018   Urine Drug Screen:     Component Value Date/Time   LABOPIA NONE DETECTED 08/02/2018 1241   COCAINSCRNUR NONE DETECTED 08/02/2018 1241   LABBENZ NONE DETECTED 08/02/2018 1241   AMPHETMU NONE DETECTED 08/02/2018 1241   THCU NONE DETECTED 08/02/2018 1241   LABBARB NONE DETECTED 08/02/2018 1241    Alcohol Level     Component Value Date/Time   ETH <10 08/02/2018 1241    IMAGING  Ct Angio Head W Or Wo Contrast Ct Angio Neck W Or Wo Contrast 08/02/2018 IMPRESSION:  1. Negative for emergent large vessel occlusion  2. Moderate stenosis origin of the vertebral arteries bilaterally. Atherosclerotic disease of  the carotid bifurcation left greater than right without hemodynamically significant stenosis  3. Mild to moderate stenosis distal ACA bilaterally. Moderate stenosis P2 segment bilaterally.    Ct Head Wo Contrast 08/03/2018 IMPRESSION:  The known infarct in the posterior limb of the left internal capsule seen on recent MRI is more conspicuous on today's CT scan than the August 02, 2018 CT scan. This small infarct appears roughly stable in size compared to the comparison MRI. Chronic white matter changes. No other acute abnormalities identified.    Mr Maxine Glenn Head Wo Contrast 08/02/2018 IMPRESSION:  1. 8 mm focus of acute/early subacute infarction within the left posterior limb of internal capsule. No associated hemorrhage or mass effect.  2. Multiple very small chronic infarctions are present in bilateral frontal periventricular white matter. Mild volume loss of the brain.  3. Patent anterior and posterior intracranial circulation. No large vessel occlusion, aneurysm, or high-grade stenosis.  4. Moderate bilateral P2 stenosis. Bilateral ICA atherosclerosis with mild lumen irregularity, no high-grade stenosis.    Ct Head Code Stroke Wo Contrast 08/02/2018 IMPRESSION:  1. No acute abnormality  2. ASPECTS is 10    Transthoracic Echocardiogram  08/03/2018 Study Conclusions - Left ventricle: The cavity size was normal. There was moderate   concentric hypertrophy. Systolic function was normal. The   estimated ejection fraction was in the range of 60% to 65%. Wall   motion was normal; there were no regional wall motion   abnormalities. Doppler parameters are consistent with abnormal   left ventricular relaxation (grade 1 diastolic dysfunction). - Aortic valve: There was no regurgitation. - Mitral valve: Calcified annulus. Mildly thickened leaflets . - Left atrium: The atrium was normal in size. - Right ventricle: Systolic function was normal. - Tricuspid valve: There was mild regurgitation. -  Pulmonic valve: There was no regurgitation. - Pulmonary arteries: Systolic pressure was within the normal   range. - Inferior vena cava: The vessel was normal in size. - Pericardium, extracardiac: There was no pericardial effusion. Impressions: - No cardiac source of emboli was indentified.   PHYSICAL EXAM Temp:  [97.6 F (36.4 C)-99.1 F (37.3 C)] 99.1 F (37.3 C) (09/07 1505) Pulse Rate:  [69-89] 75 (09/07 0705) Resp:  [17-27] 20 (09/07 0705) BP: (141-191)/(81-102) 146/82 (09/07 1310) SpO2:  [85 %-96 %] 96 % (09/07 1310)  General - Well nourished, well developed, in no apparent distress.  Ophthalmologic - fundi not visualized due to noncooperation.  Cardiovascular - Regular rate and rhythm with no murmur.  Mental Status -  Level of arousal and orientation to time, place, and person were intact. Language including expression, naming, repetition, comprehension was assessed and found intact. Moderate dysarthria Fund of Knowledge was assessed and was intact.  Cranial Nerves II - XII - II - Visual field intact OU. III, IV, VI - Extraocular movements intact. V - Facial sensation intact bilaterally. VII - right facial droop. VIII - Hearing & vestibular intact bilaterally. X - Palate elevates symmetrically. XI - Chin turning & shoulder shrug intact bilaterally. XII - Tongue protrusion intact.  Motor Strength - The patient's strength was normal in LUE and LLE extremities however, RUE 3-/5 proximal and 0/5 distally, RLE 4+/5 proximal and 3+/5 distally.  Bulk was normal and fasciculations were absent.   Motor Tone - Muscle tone was assessed at the neck and appendages and was normal.  Reflexes - The patient's reflexes were symmetrical in all extremities and he had no pathological reflexes.  Sensory - Light touch, temperature/pinprick were assessed and were symmetrical.    Coordination - The patient had normal movements in the left hand with no ataxia or dysmetria.  Tremor was  absent.  Gait and Station - deferred.     ASSESSMENT/PLAN [name] is a 57 y.o. male with history of DM and Hld presenting with right sided weakness. He did not receive IV t-PA due to late presentation.  Stroke: left posterior limb of internal capsule likely small vessel disease given uncontrolled risk factors.  Resultant  Right facial droop, right hemiparesis  CT head - No acute abnormality   MRI head - 8 mm focus of acute/early subacute infarction within the left posterior limb of internal capsule. Multiple very small chronic infarctions are present in bilateral frontal periventricular white matter  MRA head - Moderate bilateral P2 stenosis.  CTA H&N - Moderate stenosis origin of the vertebral arteries bilaterally. Mild to moderate stenosis distal ACA bilaterally. Moderate stenosis P2 segment bilaterally.   2D Echo  - EF 60 - 65%. No cardiac source of emboli identified.   LDL - UTC due to Triglycerides 1,356  HgbA1c - 12.3  VTE prophylaxis - Lovenox  Diet  - Heart healthy with thin liquids.  No antithrombotic prior to admission, now on asa 81 mg and Plavix 75 mg daily. Continue DAPT for 3 weeks and then ASA alone  Patient counseled to be compliant with his antithrombotic medications  Ongoing aggressive stroke risk factor management  Therapy recommendations:  pending  Disposition:  Pending  Hypertension  Stable on the high side . Permissive hypertension (OK if < 220/120) but gradually normalize in 5-7 days . Long-term BP goal normotensive  Hyperlipidemia  Lipid lowering medication PTA:  none  LDL UTC due to high TG 1356, goal < 70  Current lipid lowering medication: Lipitor 40 mg daily and fenofibrate 160 mg daily  Continue statin and fenofibrate at discharge  Close PCP follow up if TG continues high after DM under control, consider PCSK9 inhibitors for high TG  Diabetes  HgbA1c 12.3, goal < 7.0  Uncontrolled  Hyperglycemia  On  lantus  SSI  CBG monitoring  Consider DM coordinator consult  Close PCP follow up for better DM control  Other Stroke Risk Factors  ETOH use, advised to drink no more than 1 alcoholic beverage per day.  Hx stroke/TIA (by imaging)  Other Active Problems  high Triglycerides 1,356 - could be related to uncontrolled DM or genetic   Hyponatremia - 130  Mildly elevated BUN - 32  Hospital day # 0  Neurology will sign off. Please call with questions. Pt will follow up with stroke clinic NP at [facility] in about 4 weeks. Thanks for the consult.  Marvel Plan, MD PhD Stroke Neurology 08/03/2018 4:31 PM   To contact Stroke Continuity provider, please refer to [email or web address]. After hours, contact General Neurology

## 2018-08-03 NOTE — Progress Notes (Signed)
Notified [place], MD pt has decreased strength to right hand with increased slurred speech. Pt was able to squeeze right hand with moderate grip and now is only able to barely move fingers voluntarily. MD states she is coming to bedside and is ordering a STAT head CT.

## 2018-08-03 NOTE — Progress Notes (Signed)
CBG checked and within pt's normal range with MD at bedside. CT called and transport ordered for pt to go for STAT head CT.

## 2018-08-03 NOTE — Progress Notes (Signed)
Family Medicine Teaching Service Daily Progress Note Intern Pager: [phone]  Patient name: Victor Moreno Medical record number: [id number] Date of birth: 28-Dec-1960 Age: 57 y.o. Gender: male  Primary Care Provider: System, Pcp Not In Consultants: Neurology  Code Status: full   Pt Overview and Major Events to Date:   57 year old man with PMH T2DM, HLD, peripheral neuropathy presented with left internal capsule stroke. Right sided weakness and slurred speech had improved by the time of arrival but acutely worsened overnight. Repeat CT scan overnight showed that the stroke remained stable in size, there was not concern for hemorrhagic conversion. It was noted that his blood pressure had improved spontaneously from 200s to 150s around the time of these symptoms.    Assessment and Plan:  Left internal capsule stroke  - deficits right hand grip 2/5, right hip flexion 4+/5, right upper and lower extremity mild ataxia, slurred speech, right facial droop  - lipid panel with tag 1300, LDL unable to calculate - starting fenofibrate and further plan mentioned below  - A1c 12 - adjusting insulin mentioned below  - echo complete, results in process  - carotid dopplers prelim results with 1-39% ICA stenosis   - continue ASA 81 mg daily and plavix 75 mg daily both started this admission  - continue with permissive hypertension  - continue neuro checks q4h  - continue tele monitoring while inpatient to evaluate for paroxysmal atrial fib - PT recs outpatient PT and signed off, this was before his deficits worsened overnight, it would be beneficial to have him reevaluated today   - OT/ SLP to evaluate - continue regular diet   Hypertension  - Blood pressure remains elevated this morning  - continue to allow for permissive HTN - hydral ordered PRN for BP > 220/110   T2 DM  - glucose 200-400 over past 24 hours, AM fasting glucose was 300  - currently on lantus 10 units qHS, novolog 3 units TID wc,  with sliding scale sensitive for meal time and bedtime  - A1c 12.3 at the time of this admission, home meds are lantus 10 units qd and metformin 2000 mg qd  - increase lantus to 13 units qHS, will give 3 units of lantus now - increase novolog to 5 units TID  - continue sensitive sliding scale and bedtime coverage  - continue CBG monitoring   Severe hypertriglyceridemia  - Fasting TAG  1300, LDL couldn't be calculated   -  Was on atorvastatin 40 mg daily at the time of admission, renal function is good so I will transition to rosuvastatin 20 to reduce the risk of myopathy as he is in need of fibrate therapy  - will encourage better glucose control and weight loss through aerobic exercise to improve the TAG level - encourage low fat diet and avoidance of ETOH to reduce the risk of precipitating pancreatitis  - start fenofibrate to reduce the risk of pancreatitis, high intensity rosuvastatin should have some TAG lowering affect as well  -recheck TAG level in 6-8 weeks   FEN/GI: carb modified  PPx: lovenox   Disposition: continue tele monitoring, dispo pending control of blood pressure when he is outside of the permissive hypertension window. Will need outpatient PT   Subjective:  [name] is feeling worried about his current neurologic deficits. Overnight it was noticed that his deficits had worsened, follow up CT showed no change in the size of the stroke or hemorrhagic conversion. Blood pressure was noted to be  lower than the time of arrival prior to the change in his strength, this was without intervention with antihypertensives. He was updated on the plan for hyper triglyceridemia and hyperglycemia. He and his family ( wife, mother, brother) did not have any questions. He did mention to me that he didn't receive morning insulin but I checked the chart and it is recorded as given.   Objective: Temp:  [97.6 F (36.4 C)-98.4 F (36.9 C)] 98.4 F (36.9 C) (09/07 0821) Pulse Rate:  [69-96]  75 (09/07 0705) Resp:  [17-27] 20 (09/07 0705) BP: (141-201)/(81-102) 141/85 (09/07 0915) SpO2:  [85 %-96 %] 94 % (09/07 0705) Weight:  [86.2 kg] 86.2 kg (09/06 1309) Physical Exam: General: no acute distress, right facial droop, slurred speech Cardiovascular: regular rate and rhythm, no murmur appreciated  Respiratory: normal work of breathing, not requiring supplemental oxygen, lungs clear to auscultation  Abdomen: soft, non tender, non distended  Neuro: Oriented to self, birthdate, month, year, date and place  CN: visual fields are intact, pupils are round and equal, extra ocular muscles are intact, right sided lower facial droop, tongue deviates midline, uvula is midline, shoulder shrug is strong  Strength : right shoulder abduction strength 2/5, right upper extremity elbow flexion and extension 5/5, the right hand is 2/5. Left upper extremity distal and proximal strength 5/5. Right lower extremity hip flexion 4/5, left hip flexion 5/5, bilateral lower extremity knee flexion and extension 5/5, bilateral lower extremity hip flexion and extension 5/5  Sensation: sensation equal and intact over the face, upper and lower extremities  CN: mild ataxia of the right upper and lower extremity with finger to nose and heel to shin testing? Related to strength deficits, left FTN and HTS testing normal   Laboratory: Recent Labs  Lab 08/02/18 1241 08/02/18 1243  WBC 8.6  --   HGB 15.6 15.3  HCT 45.3 45.0  PLT 211  --    Recent Labs  Lab 08/02/18 1241 08/02/18 1243  NA 131* 130*  K 4.2 4.2  CL 95* 99  CO2 23  --   BUN 28* 32*  CREATININE 0.84 0.80  CALCIUM 9.8  --   PROT 6.9  --   BILITOT 2.0*  --   ALKPHOS 96  --   ALT 40  --   AST 23  --   GLUCOSE 440* 457*   TAG 1300, LDL undetectable  Hgb A1c 12   Imaging/Diagnostic Tests:  MRI, MRA brain 08/02/2018 2215 1. 8 mm focus of acute/early subacute infarction within the left posterior limb of internal capsule. No associated  hemorrhage or mass effect. 2. Multiple very small chronic infarctions are present in bilateral frontal periventricular white matter. Mild volume loss of the brain. 3. Patent anterior and posterior intracranial circulation. No large vessel occlusion, aneurysm, or high-grade stenosis. 4. Moderate bilateral P2 stenosis. Bilateral ICA atherosclerosis with mild lumen irregularity, no high-grade stenosis.  CT head 08/03/18 8 am  The known infarct in the posterior limb of the left internal capsule seen on recent MRI is more conspicuous on today's CT scan than the August 02, 2018 CT scan. This small infarct appears roughly stable in size compared to the comparison MRI. Chronic white matter changes. No other acute abnormalities identified.  Carotid ultrasound 08/02/18  In process, prelim read with 1-39% bl stenosis   Echo 08/02/09  Read in process   Eulah Pont, MD 08/03/2018, 10:19 AM PGY-3, [facility] Internal Medicine FPTS Intern pager: [phone], text pages welcome

## 2018-08-03 NOTE — Progress Notes (Signed)
FPTS Interim Progress Note:  Patient admitted for stroke workup due to right UE weakness and slurred speech which significantly improved by time of arrival and eval in the hospital. MRI brain revealed 28mm acute/subacute   Paged by RN that patient's has had worsening hand grip compared to more recent evaluations (last one ~1am).  Evaluated patient at bedside; he states that he noticed worsening of right hand weakness and slurred speech at around 10pm - he states he called his friend but did not let RN know. He denies LE weakness, any numbness/tingling.  On exam deficits noted are mild right facial droop, mild dysarthria w/o aphasia, hand grip 3/5 on R, flexion and extension at R elbow 4/5, dis coordination of RU and RLEs.  Otherwise, RRR, lungs clear. He is A&Ox3.  BP has decreased w/o intervention from SBP 200s to 150s since admission. CBG 200.  Assessment and Plan: Concern for hemorrhagic conversion, extension of infarct. He may also have fluctuations due to BP changes. No high grade stenosis noted on previous imaging for intervention. He has already had asa and plavix loads. --CT head stat --neuro aware and agree with plan  Nyra Market, MD [phone]

## 2018-08-04 DIAGNOSIS — E119 Type 2 diabetes mellitus without complications: Secondary | ICD-10-CM

## 2018-08-04 DIAGNOSIS — E781 Pure hyperglyceridemia: Secondary | ICD-10-CM | POA: Diagnosis present

## 2018-08-04 DIAGNOSIS — I10 Essential (primary) hypertension: Secondary | ICD-10-CM | POA: Diagnosis present

## 2018-08-04 DIAGNOSIS — Z794 Long term (current) use of insulin: Secondary | ICD-10-CM

## 2018-08-04 LAB — BASIC METABOLIC PANEL
ANION GAP: 9 (ref 5–15)
BUN: 19 mg/dL (ref 6–20)
CO2: 26 mmol/L (ref 22–32)
Calcium: 9 mg/dL (ref 8.9–10.3)
Chloride: 99 mmol/L (ref 98–111)
Creatinine, Ser: 0.76 mg/dL (ref 0.61–1.24)
Glucose, Bld: 259 mg/dL — ABNORMAL HIGH (ref 70–99)
POTASSIUM: 3.7 mmol/L (ref 3.5–5.1)
SODIUM: 134 mmol/L — AB (ref 135–145)

## 2018-08-04 LAB — GLUCOSE, CAPILLARY
GLUCOSE-CAPILLARY: 363 mg/dL — AB (ref 70–99)
GLUCOSE-CAPILLARY: 334 mg/dL — AB (ref 70–99)
Glucose-Capillary: 313 mg/dL — ABNORMAL HIGH (ref 70–99)
Glucose-Capillary: 176 mg/dL — ABNORMAL HIGH (ref 70–99)

## 2018-08-04 MED ORDER — INSULIN GLARGINE 100 UNIT/ML ~~LOC~~ SOLN
20.0000 [IU] | Freq: Every day | SUBCUTANEOUS | Status: DC
  Administered 2018-08-04 – 2018-08-05 (×2): 20 [IU] via SUBCUTANEOUS
  Filled 2018-08-04 (×3): qty 0.2

## 2018-08-04 MED ORDER — INSULIN ASPART 100 UNIT/ML ~~LOC~~ SOLN
7.0000 [IU] | Freq: Three times a day (TID) | SUBCUTANEOUS | Status: DC
  Administered 2018-08-04 – 2018-08-06 (×8): 7 [IU] via SUBCUTANEOUS

## 2018-08-04 MED ORDER — INSULIN GLARGINE 100 UNIT/ML ~~LOC~~ SOLN
3.0000 [IU] | Freq: Once | SUBCUTANEOUS | Status: AC
  Administered 2018-08-04: 3 [IU] via SUBCUTANEOUS
  Filled 2018-08-04: qty 0.03

## 2018-08-04 NOTE — Evaluation (Signed)
Occupational Therapy Evaluation Patient Details Name: Victor Moreno MRN: [id number] DOB: 1961-05-17 Today's Date: 08/04/2018    History of Present Illness Victor Moreno is a right-handed 57 y.o. male presenting with slurred speech, right-sided weakness. PMH is significant for T2DM, HLD. Progression of symptoms overnight during. 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   Clinical Impression   Pt admitted with the above diagnoses and presents with below problem list. Pt will benefit from continued acute OT to address the below listed deficits and maximize independence with basic ADLs prior to d/c to venue below. PTA pt was independent with ADLs, works, drives. Pt presents with L side weakness and impaired balance impacting assist level with ADLs.  Pt is currently mod with ADLs including functional mobility/transfers. Feel pt would be a great candidate for intensive rehab program prior to d/c home.     Follow Up Recommendations  CIR    Equipment Recommendations  Other (comment)(TBD next venue)    Recommendations for Other Services Rehab consult     Precautions / Restrictions Precautions Precautions: Fall Restrictions Weight Bearing Restrictions: No      Mobility Bed Mobility Overal bed mobility: Needs Assistance Bed Mobility: Supine to Sit     Supine to sit: Min assist     General bed mobility comments: Min assist to transition trunk to EOB and upright. Assist for hip rotation and max cues for positoning  Transfers Overall transfer level: Needs assistance Equipment used: 1 person hand held assist Transfers: Sit to/from Stand Sit to Stand: Min assist         General transfer comment: Min assist for stability during power to upright and in standing. Noted posterior list with LEs resting against bed despite physical assist    Balance Overall balance assessment: Needs assistance Sitting-balance support: Single extremity supported;Feet  supported Sitting balance-Leahy Scale: Fair     Standing balance support: No upper extremity supported Standing balance-Leahy Scale: Poor Standing balance comment: fair static, poor dynamic                         ADL either performed or assessed with clinical judgement   ADL Overall ADL's : Needs assistance/impaired Eating/Feeding: Sitting;Moderate assistance   Grooming: Moderate assistance   Upper Body Bathing: Sitting;Moderate assistance   Lower Body Bathing: Moderate assistance;Sit to/from stand Lower Body Bathing Details (indicate cue type and reason): Able to position right foot onto left knee. Assist to load sock onto foot. Pt able to finish task using L hand Upper Body Dressing : Moderate assistance;Sitting   Lower Body Dressing: Sit to/from stand;Moderate assistance   Toilet Transfer: Moderate assistance;Ambulation Toilet Transfer Details (indicate cue type and reason): 1 LOB to the right while standing to void. Assist to prevent fall. Toileting- Clothing Manipulation and Hygiene: Moderate assistance;Sit to/from stand   Tub/ Engineer, structural: Moderate assistance;Ambulation   Functional mobility during ADLs: Minimal assistance;Moderate assistance General ADL Comments: Pt completed bed mobility, in-room functional mobility, voided standing at toilet (1 LOB to the right), asssist to correct.     Vision Patient Visual Report: No change from baseline       Perception     Praxis      Pertinent Vitals/Pain Pain Assessment: No/denies pain     Hand Dominance Right   Extremity/Trunk Assessment Upper Extremity Assessment Upper Extremity Assessment: RUE deficits/detail RUE Deficits / Details: grossly 2/5. Stronger proximal than distal. Pt reports sensation intact.  RUE Coordination:  decreased fine motor;decreased gross motor   Lower Extremity Assessment Lower Extremity Assessment: RLE deficits/detail(progression of weakness apparent) RLE Deficits /  Details: noted assymetrical weakness RLE 3+/5 gross motions RLE Coordination: decreased fine motor;decreased gross motor       Communication Communication Communication: (some dysarthric speech noted)   Cognition Arousal/Alertness: Awake/alert Behavior During Therapy: WFL for tasks assessed/performed Overall Cognitive Status: Within Functional Limits for tasks assessed                                     General Comments       Exercises Exercises: Other exercises Other Exercises Other Exercises: began education on AAROM/PROM exercises using LUE to support RUE.   Shoulder Instructions      Home Living Family/patient expects to be discharged to:: Private residence Living Arrangements: Alone Available Help at Discharge: Family;Available PRN/intermittently(mom and brother) Type of Home: House Home Access: Stairs to enter           Bathroom Shower/Tub: Chief Strategy Officer: Standard                Prior Functioning/Environment Level of Independence: Independent(Works, drives)                 OT Problem List: Decreased range of motion;Decreased strength;Decreased activity tolerance;Impaired balance (sitting and/or standing);Decreased coordination;Decreased knowledge of use of DME or AE;Decreased knowledge of precautions;Impaired tone;Impaired UE functional use      OT Treatment/Interventions: Self-care/ADL training;Therapeutic exercise;Neuromuscular education;DME and/or AE instruction;Therapeutic activities;Patient/family education;Balance training    OT Goals(Current goals can be found in the care plan section) Acute Rehab OT Goals OT Goal Formulation: With patient Time For Goal Achievement: 08/18/18 Potential to Achieve Goals: Good ADL Goals Pt Will Perform Grooming: with min assist;sitting;standing Pt Will Perform Upper Body Bathing: with set-up;sitting Pt Will Perform Lower Body Bathing: sit to/from stand;with min guard  assist;with adaptive equipment Pt Will Perform Upper Body Dressing: with set-up;with adaptive equipment;sitting Pt Will Perform Lower Body Dressing: with min guard assist;with adaptive equipment;sit to/from stand Pt Will Transfer to Toilet: with min guard assist;ambulating Pt Will Perform Toileting - Clothing Manipulation and hygiene: with min guard assist;sit to/from stand Pt Will Perform Tub/Shower Transfer: with min guard assist;ambulating;3 in 1 Pt/caregiver will Perform Home Exercise Program: Increased strength;Right Upper extremity;With written HEP provided;Independently  OT Frequency: Min 3X/week   Barriers to D/C:            Co-evaluation PT/OT/SLP Co-Evaluation/Treatment: Yes Reason for Co-Treatment: Complexity of the patient's impairments (multi-system involvement) PT goals addressed during session: Mobility/safety with mobility OT goals addressed during session: ADL's and self-care      AM-PAC PT "6 Clicks" Daily Activity     Outcome Measure Help from another person eating meals?: A Little Help from another person taking care of personal grooming?: A Lot Help from another person toileting, which includes using toliet, bedpan, or urinal?: A Lot Help from another person bathing (including washing, rinsing, drying)?: A Lot Help from another person to put on and taking off regular upper body clothing?: A Lot Help from another person to put on and taking off regular lower body clothing?: A Lot 6 Click Score: 13   End of Session Equipment Utilized During Treatment: Gait belt Nurse Communication: Mobility status  Activity Tolerance: Patient tolerated treatment well Patient left: in chair;with call bell/phone within reach;with chair alarm set  OT Visit Diagnosis: Unsteadiness on feet (  R26.81);Hemiplegia and hemiparesis Hemiplegia - Right/Left: Right Hemiplegia - dominant/non-dominant: Dominant                Time: 1610-9604 OT Time Calculation (min): 26 min Charges:  OT  General Charges $OT Visit: 1 Visit OT Evaluation $OT Eval Low Complexity: 1 Low    Pilar Grammes 08/04/2018, 9:54 AM

## 2018-08-04 NOTE — Evaluation (Signed)
Physical Therapy Evaluation Patient Details Name: OREE HISLOP MRN: [id number] DOB: 07-07-1961 Today's Date: 08/04/2018   History of Present Illness  Victor Moreno is a right-handed 57 y.o. male presenting with slurred speech, right-sided weakness. PMH is significant for T2DM, HLD. Progression of symptoms overnight during. 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  Clinical Impression  Orders received for PT re-evaluation due to progression of symptoms. Patient demonstrates deficits in functional mobility as indicated below. Will benefit from continued skilled PT to address deficits and maximize function. Will see as indicated and progress as tolerated. At this time, impairments do appear worse than initial presentation with patient now requiring increased levels of physical assist for all aspects of mobility. Given prior level of function and current status, feel patient will need comprehensive therapies to address deficits and maximize potential to return to independence. Recommending CIR consult at this time.     Follow Up Recommendations CIR    Equipment Recommendations  (TBD)    Recommendations for Other Services Rehab consult     Precautions / Restrictions Precautions Precautions: Fall      Mobility  Bed Mobility Overal bed mobility: Needs Assistance Bed Mobility: Supine to Sit     Supine to sit: Min assist     General bed mobility comments: Min assist to transition trunk to EOB and upright. Assist for hip rotation and max cues for positoning  Transfers Overall transfer level: Needs assistance Equipment used: 1 person hand held assist Transfers: Sit to/from Stand Sit to Stand: Min assist         General transfer comment: Min assist for stability during power to upright and in standing. Noted posterior list with LEs resting against bed despite physical assist  Ambulation/Gait Ambulation/Gait assistance: Min assist;Mod  assist Gait Distance (Feet): 60 Feet Assistive device: 1 person hand held assist Gait Pattern/deviations: Step-through pattern;Decreased stride length;Ataxic;Staggering right Gait velocity: varying velocity   General Gait Details: patient with poor ability to coordinate steps and maintain balance. Several overt LOB with significantly limited ability to control velocity. Patient running into walls and objects. Moderate physical assist to prevent falls and establish functional gait.  Stairs            Wheelchair Mobility    Modified Rankin (Stroke Patients Only) Modified Rankin (Stroke Patients Only) Pre-Morbid Rankin Score: Slight disability Modified Rankin: Moderately severe disability     Balance                                     Dynamic Gait Index Level Surface: Mild Impairment Change in Gait Speed: Moderate Impairment Gait with Horizontal Head Turns: Moderate Impairment Gait with Vertical Head Turns: Moderate Impairment Gait and Pivot Turn: Moderate Impairment Step Over Obstacle: Mild Impairment Step Around Obstacles: Moderate Impairment       Pertinent Vitals/Pain Pain Assessment: No/denies pain    Home Living Family/patient expects to be discharged to:: Private residence Living Arrangements: Alone Available Help at Discharge: Family;Available PRN/intermittently(mom and brother) Type of Home: House Home Access: Stairs to enter              Prior Function Level of Independence: Independent(Works, drives)               Hand Dominance   Dominant Hand: Right    Extremity/Trunk Assessment   Upper Extremity Assessment Upper Extremity Assessment: Defer to OT evaluation  Lower Extremity Assessment Lower Extremity Assessment: RLE deficits/detail(progression of weakness apparent) RLE Deficits / Details: noted assymetrical weakness RLE 3+/5 gross motions RLE Coordination: decreased fine motor;decreased gross motor        Communication   Communication: (some dysarthric speech noted)  Cognition Arousal/Alertness: Awake/alert Behavior During Therapy: WFL for tasks assessed/performed Overall Cognitive Status: Within Functional Limits for tasks assessed                                        General Comments      Exercises     Assessment/Plan    PT Assessment Patient needs continued PT services  PT Problem List Decreased strength;Decreased activity tolerance;Decreased balance;Decreased mobility;Decreased coordination;Decreased cognition       PT Treatment Interventions DME instruction;Gait training;Stair training;Functional mobility training;Therapeutic activities;Therapeutic exercise;Balance training;Neuromuscular re-education;Patient/family education    PT Goals (Current goals can be found in the Care Plan section)       Frequency Min 4X/week   Barriers to discharge        Co-evaluation PT/OT/SLP Co-Evaluation/Treatment: Yes Reason for Co-Treatment: Complexity of the patient's impairments (multi-system involvement) PT goals addressed during session: Mobility/safety with mobility OT goals addressed during session: ADL's and self-care       AM-PAC PT "6 Clicks" Daily Activity  Outcome Measure Difficulty turning over in bed (including adjusting bedclothes, sheets and blankets)?: Unable Difficulty moving from lying on back to sitting on the side of the bed? : Unable Difficulty sitting down on and standing up from a chair with arms (e.g., wheelchair, bedside commode, etc,.)?: Unable Help needed moving to and from a bed to chair (including a wheelchair)?: A Little Help needed walking in hospital room?: A Lot Help needed climbing 3-5 steps with a railing? : A Lot 6 Click Score: 10    End of Session Equipment Utilized During Treatment: Gait belt Activity Tolerance: Patient tolerated treatment well Patient left: in chair;with call bell/phone within reach;with chair alarm  set Nurse Communication: Mobility status PT Visit Diagnosis: Unsteadiness on feet (R26.81);Ataxic gait (R26.0);Difficulty in walking, not elsewhere classified (R26.2);Other symptoms and signs involving the nervous system (R29.898)    Time: 7014-1030 PT Time Calculation (min) (ACUTE ONLY): 21 min   Charges:   PT Evaluation $PT Re-evaluation: 1 Re-eval          Charlotte Crumb, PT DPT  Board Certified Neurologic Specialist [phone]   Fabio Asa 08/04/2018, 9:05 AM

## 2018-08-04 NOTE — Plan of Care (Signed)
  Problem: Education: Goal: Knowledge of disease or condition will improve Outcome: Progressing Goal: Knowledge of secondary prevention will improve Outcome: Progressing Goal: Knowledge of patient specific risk factors addressed and post discharge goals established will improve Outcome: Progressing   Problem: Coping: Goal: Will verbalize positive feelings about self Outcome: Progressing Goal: Will identify appropriate support needs Outcome: Progressing   Problem: Health Behavior/Discharge Planning: Goal: Ability to manage health-related needs will improve Outcome: Progressing   Problem: Self-Care: Goal: Ability to participate in self-care as condition permits will improve Outcome: Progressing Goal: Verbalization of feelings and concerns over difficulty with self-care will improve Outcome: Progressing Goal: Ability to communicate needs accurately will improve Outcome: Progressing   Problem: Nutrition: Goal: Risk of aspiration will decrease Outcome: Progressing   Problem: Ischemic Stroke/TIA Tissue Perfusion: Goal: Complications of ischemic stroke/TIA will be minimized Outcome: Progressing   Problem: Education: Goal: Knowledge of General Education information will improve Description: Including pain rating scale, medication(s)/side effects and non-pharmacologic comfort measures Outcome: Progressing   Problem: Health Behavior/Discharge Planning: Goal: Ability to manage health-related needs will improve Outcome: Progressing   Problem: Clinical Measurements: Goal: Ability to maintain clinical measurements within normal limits will improve Outcome: Progressing Goal: Will remain free from infection Outcome: Progressing Goal: Diagnostic test results will improve Outcome: Progressing Goal: Respiratory complications will improve Outcome: Progressing Goal: Cardiovascular complication will be avoided Outcome: Progressing   Problem: Activity: Goal: Risk for activity  intolerance will decrease Outcome: Progressing   Problem: Nutrition: Goal: Adequate nutrition will be maintained Outcome: Progressing   Problem: Coping: Goal: Level of anxiety will decrease Outcome: Progressing   Problem: Elimination: Goal: Will not experience complications related to bowel motility Outcome: Progressing Goal: Will not experience complications related to urinary retention Outcome: Progressing   Problem: Pain Managment: Goal: General experience of comfort will improve Outcome: Progressing   Problem: Safety: Goal: Ability to remain free from injury will improve Outcome: Progressing   Problem: Skin Integrity: Goal: Risk for impaired skin integrity will decrease Outcome: Progressing   

## 2018-08-04 NOTE — Progress Notes (Signed)
Family Medicine Teaching Service Daily Progress Note Intern Pager: [phone]  Patient name: Victor Moreno Medical record number: [id number] Date of birth: Dec 16, 1960 Age: 57 y.o. Gender: male  Primary Care Provider: System, Pcp Not In Consultants: Neurology  Code Status: full   Pt Overview and Major Events to Date:   57 year old man with PMH T2DM, HLD, peripheral neuropathy presented with left internal capsule stroke. Right sided weakness and slurred speech had improved by the time of arrival but acutely worsened overnight 9/6. Repeat CT scan overnight showed that the stroke remained stable in size, there was not concern for hemorrhagic conversion. It was noted that his blood pressure had improved spontaneously from 200s to 150s around the time of these symptoms.    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, HTN, and hypertriglyceridemia - deficits right facial droop, dysarthria, right hand grip 2/5, right forearm extension 3/5 right hip flexion 4+/5 - lipid panel with tag 1300, LDL unable to calculate - started fenofibrate  - A1c 12 - adjusting insulin mentioned below  - continue ASA 81 mg daily and plavix 75 mg daily both started this admission, plan is for 3 weeks of DAPT then aspirin alone  - continue with permissive hypertension with gradual control to normotension over the next few days   - echo without cardiac source of embolus, carotid dopplers prelim results with 1-39% ICA stenosis   - continue tele monitoring while inpatient to evaluate for paroxysmal arrhythmia  - PT recs CIR, consult to CIR placed  - OT/ SLP to evaluate  Hypertension  - blood pressure is gradually improving without intervention  - hydral ordered PRN for BP > 220/110   T2 DM  - glucose 250-350 over past 24 hours, AM fasting glucose was 363 - currently on lantus 10 units qHS, novolog 3 units TID wc, with sliding scale sensitive for meal time and bedtime  - A1c  12.3 at the time of this admission, home meds are lantus 10 units qd and metformin 2000 mg qd  - increase lantus to 19 units qHS, will give 3 units of lantus now - increase novolog to 7 units TID WC - continue sensitive sliding scale and bedtime coverage  - continue CBG monitoring   Severe hypertriglyceridemia  - no report of symptoms of myopathy since beginning fibrate therapy  - Fasting TAG  1300, LDL couldn't be calculated   -  Was on atorvastatin 40 mg daily prior to admission, transitioned to rosuvastatin 20 to reduce the risk of myopathy as he needed to start fibrate therapy - continue fenofibtate  - continue to encourage better glucose control and weight loss through aerobic exercise to improve the TAG level - encourage low fat diet and avoidance of ETOH to reduce the risk of precipitating pancreatitis  -recheck TAG level in 6-8 weeks   FEN/GI: carb modified  PPx: lovenox   Disposition: continue tele monitoring, hopeful that he will be a good candidate for CIR. will continue to work on blood pressure and blood glucose control while awaiting their reccs   Subjective:  [name] feels that his deficits have remained stable overnight. He has no new concerns or complaints. He had attempted to ambulate with PT just prior and felt that he needed a moderate amount of assistance. We discussed the plan for risk factor control moving forward. He had no questions.  Objective: Temp:  [97.7 F (36.5 C)-99.1 F (37.3 C)] 98.3 F (36.8 C) (09/08  6387) Pulse Rate:  [64-90] 64 (09/08 0317) Resp:  [17-25] 17 (09/08 0317) BP: (121-171)/(66-97) 121/66 (09/08 0317) SpO2:  [94 %-99 %] 99 % (09/08 0317) Physical Exam: General: no acute distress, right facial droop, slurred speech Cardiovascular: regular rate and rhythm, no murmur appreciated  Respiratory: normal work of breathing, not requiring supplemental oxygen, lungs clear to auscultation  Abdomen: soft, non tender Neuro: Oriented and  alert   CN: visual fields are intact, pupils are equal round and equal, extra ocular muscles are intact, right sided lower facial droop, tongue deviates midline, uvula is midline, shoulder shrug is strong  Strength : right shoulder abduction 5/5, right upper extremity elbow flexion 5/5 extension 3/5 the right hand grip is 2/5. Left upper extremity distal and proximal strength 5/5. Right lower extremity hip flexion 4/5, left hip flexion 5/5, bilateral lower extremity knee flexion and extension 5/5, bilateral lower extremity hip flexion and extension 5/5  Sensation: sensation equal and intact over the face, upper and lower extremities   Laboratory: Recent Labs  Lab 08/02/18 1241 08/02/18 1243  WBC 8.6  --   HGB 15.6 15.3  HCT 45.3 45.0  PLT 211  --    Recent Labs  Lab 08/02/18 1241 08/02/18 1243 08/04/18 0316  NA 131* 130* 134*  K 4.2 4.2 3.7  CL 95* 99 99  CO2 23  --  26  BUN 28* 32* 19  CREATININE 0.84 0.80 0.76  CALCIUM 9.8  --  9.0  PROT 6.9  --   --   BILITOT 2.0*  --   --   ALKPHOS 96  --   --   ALT 40  --   --   AST 23  --   --   GLUCOSE 440* 457* 259*   TAG 1300, LDL undetectable  Hgb A1c 12   Imaging/Diagnostic Tests:  MRI, MRA brain 08/02/2018 2215 1. 8 mm focus of acute/early subacute infarction within the left posterior limb of internal capsule. No associated hemorrhage or mass effect. 2. Multiple very small chronic infarctions are present in bilateral frontal periventricular white matter. Mild volume loss of the brain. 3. Patent anterior and posterior intracranial circulation. No large vessel occlusion, aneurysm, or high-grade stenosis. 4. Moderate bilateral P2 stenosis. Bilateral ICA atherosclerosis with mild lumen irregularity, no high-grade stenosis.  CT head 08/03/18 8 am  The known infarct in the posterior limb of the left internal capsule seen on recent MRI is more conspicuous on today's CT scan than the August 02, 2018 CT scan. This small infarct  appears roughly stable in size compared to the comparison MRI. Chronic white matter changes. No other acute abnormalities identified.  Carotid ultrasound 08/02/18  In process, prelim read with 1-39% bl stenosis   Echo 08/02/09  - Left ventricle: The cavity size was normal. There was moderate   concentric hypertrophy. Systolic function was normal. The   estimated ejection fraction was in the range of 60% to 65%. Wall   motion was normal; there were no regional wall motion   abnormalities. Doppler parameters are consistent with abnormal   left ventricular relaxation (grade 1 diastolic dysfunction). - Aortic valve: There was no regurgitation. - Mitral valve: Calcified annulus. Mildly thickened leaflets . - Left atrium: The atrium was normal in size. - Right ventricle: Systolic function was normal. - Tricuspid valve: There was mild regurgitation. - Pulmonic valve: There was no regurgitation. - Pulmonary arteries: Systolic pressure was within the normal   range. - Inferior vena cava: The  vessel was normal in size. - Pericardium, extracardiac: There was no pericardial effusion. - No cardiac source of emboli was indentified.  Eulah Pont, MD 08/04/2018, 9:01 AM PGY-3, [facility] Internal Medicine FPTS Intern pager: [phone], text pages welcome

## 2018-08-04 NOTE — Progress Notes (Signed)
Patient ambulated with two assists in hall with gait belt.  Was able to ambulate approximately 90 feet before tiring.  Was impulsive and reluctant to follow instructions to ambulate more slowly and erect.  Gait coordination was poor and he required moderate to maximum effort to avoid walking into walls / falling onto bed.  Appears unable to face his limitations at this time.

## 2018-08-05 DIAGNOSIS — E1142 Type 2 diabetes mellitus with diabetic polyneuropathy: Secondary | ICD-10-CM

## 2018-08-05 DIAGNOSIS — I639 Cerebral infarction, unspecified: Secondary | ICD-10-CM

## 2018-08-05 DIAGNOSIS — I5189 Other ill-defined heart diseases: Secondary | ICD-10-CM

## 2018-08-05 DIAGNOSIS — R739 Hyperglycemia, unspecified: Secondary | ICD-10-CM

## 2018-08-05 DIAGNOSIS — E781 Pure hyperglyceridemia: Secondary | ICD-10-CM

## 2018-08-05 DIAGNOSIS — I10 Essential (primary) hypertension: Secondary | ICD-10-CM

## 2018-08-05 DIAGNOSIS — I63312 Cerebral infarction due to thrombosis of left middle cerebral artery: Secondary | ICD-10-CM

## 2018-08-05 DIAGNOSIS — E785 Hyperlipidemia, unspecified: Secondary | ICD-10-CM

## 2018-08-05 DIAGNOSIS — E119 Type 2 diabetes mellitus without complications: Secondary | ICD-10-CM

## 2018-08-05 DIAGNOSIS — Z794 Long term (current) use of insulin: Secondary | ICD-10-CM

## 2018-08-05 LAB — BASIC METABOLIC PANEL
Anion gap: 11 (ref 5–15)
BUN: 18 mg/dL (ref 6–20)
CO2: 25 mmol/L (ref 22–32)
CREATININE: 0.69 mg/dL (ref 0.61–1.24)
Calcium: 8.9 mg/dL (ref 8.9–10.3)
Chloride: 101 mmol/L (ref 98–111)
GFR calc Af Amer: >60 mL/min (ref >60)
GLUCOSE: 212 mg/dL — AB (ref 70–99)
POTASSIUM: 3.7 mmol/L (ref 3.5–5.1)
SODIUM: 137 mmol/L (ref 135–145)

## 2018-08-05 LAB — GLUCOSE, CAPILLARY
GLUCOSE-CAPILLARY: 196 mg/dL — AB (ref 70–99)
GLUCOSE-CAPILLARY: 287 mg/dL — AB (ref 70–99)
Glucose-Capillary: 215 mg/dL — ABNORMAL HIGH (ref 70–99)
Glucose-Capillary: 306 mg/dL — ABNORMAL HIGH (ref 70–99)

## 2018-08-05 MED ORDER — LISINOPRIL 5 MG PO TABS
5.0000 mg | ORAL_TABLET | Freq: Every day | ORAL | Status: DC
  Administered 2018-08-05 – 2018-08-06 (×2): 5 mg via ORAL
  Filled 2018-08-05 (×2): qty 1

## 2018-08-05 NOTE — Progress Notes (Addendum)
Inpatient Diabetes Program Recommendations  AACE/ADA: New Consensus Statement on Inpatient Glycemic Control (2015)  Target Ranges:  Prepandial:   less than 140 mg/dL      Peak postprandial:   less than 180 mg/dL (1-2 hours)      Critically ill patients:  140 - 180 mg/dL   Lab Results  Component Value Date   GLUCAP 287 (H) 08/05/2018   HGBA1C 12.3 (H) 08/03/2018    Review of Glycemic Control Results for CADE, SWAYZER (MRN [id number]) as of 08/05/2018 11:32  Ref. Range 08/04/2018 16:52 08/04/2018 21:50 08/05/2018 06:13 08/05/2018 11:24  Glucose-Capillary Latest Ref Range: 70 - 99 mg/dL 159 (H) 458 (H) 592 (H) 287 (H)   Diabetes history: Type 2 DM Outpatient Diabetes medications: Lantus 10 units QD, Glyxambi 25-5 mg QD Current orders for Inpatient glycemic control: Lantus 20 units QHS, Novolog 7 units TID, Novolog 0-9 units TID, Novolog 0-5 units QHS  Inpatient Diabetes Program Recommendations:    Noted increased post prandials, consider increasing meal coverage to Novolog 10 units TID (assuming that patient consumes >50% of meal).   Addendum @1300 : Spoke with patient regarding outpatient management of diabetes. Patient was not taking medications for diabetes for last 3 months, as patient lost job and subsequently lost insurance. He has co-pay cards that only work with insurance (as this was his medication assistance). The plan was to resume insurance status on the 25th, but that is in the event he is able to work. Patient does not know what to do in the event he cannot work by the 25th.  My recommendation until patient has insurance again, would be Novolin 70/30 at discharge.   Reviewed patient's current A1c of 12.3%. Explained what a A1c is and what it measures. Also reviewed goal A1c with patient, importance of good glucose control @ home, and blood sugar goals. Reviewed patho of DM, need for insulin, vascular changes, comorbidites, role of pancreas and differences between short acting vs  intermediate acting insulin.  Patient was not checking BS but has 2 meters with associated supplies. Encouraged to set a goal towards checking 2-3 times per day and the importance of following up with PCP. Discussed endocrinology and he reports that he is not interested.  Patient has no problems with two injections per day and is open to changing regimen in an attempt to lower BS and A1C. This is affordable. Provided Relion information. Discussed the importance of eating at the same time as injecting, discussed rotating sites, and survival skills. Patient able to verbalize and has no further questions. Plan for CIR.   Thanks, Lujean Rave, MSN, RNC-OB Diabetes Coordinator [phone] (8a-5p)

## 2018-08-05 NOTE — Care Management Note (Signed)
Case Management Note  Patient Details  Name: Victor Moreno MRN: [id number] Date of Birth: January 17, 1961  Subjective/Objective:    Pt admitted with CVA. He is from home alone. Pt IADL.  PcP: Dr Glenis Smoker Insurance: currently is without but states it is to start September 25 th.  Pt receives medication assistance through the manufacturer for lantus and Glyxambi. Pt denies any issues with transportation.            Action/Plan: Recommendations are for CIR. Awaiting MD consult. Pt states he plans on staying with his mother in [place] post hospitalization/ rehab and she is able to provide the support he needs. CM following for d/c needs.   Expected Discharge Date:                  Expected Discharge Plan:  IP Rehab Facility  In-House Referral:     Discharge planning Services  CM Consult  Post Acute Care Choice:    Choice offered to:     DME Arranged:    DME Agency:     HH Arranged:    HH Agency:     Status of Service:  In process, will continue to follow  If discussed at Long Length of Stay Meetings, dates discussed:    Additional Comments:  Kermit Balo, RN 08/05/2018, 10:55 AM

## 2018-08-05 NOTE — Plan of Care (Signed)
Nutrition Education Note  RD consulted for nutrition education regarding diabetes.  Spoke with pt, mother, and family member at bedside.  Pt reports typically eating 3 meals and 2 snacks daily.  Breakfast: cup of yogurt OR class of light Carnation Instant Breakfast OR eggs and sausage OR bowl of cereal  Lunch: microwave meal of [place] steak, corn, mashed potatoes, and apples for dessert  Dinner: spinach and mushroom pizza OR vegetable beef soup and peanut butter sandwich  Snacks: popcorn  Drinks: light fruit juice, water with sugar-free flavoring, "4 gallons of milk a week"  Pt and pt's mother with specific question regarding carbohydrate content of starchy vegetables, milk, and sugar-free desserts. All questions answered.  Lab Results  Component Value Date   HGBA1C 12.3 (H) 08/03/2018    RD provided "Carbohydrate Counting for People with Diabetes" handout from the Academy of Nutrition and Dietetics. Discussed different food groups and their effects on blood sugar, emphasizing carbohydrate-containing foods. Provided list of carbohydrates and recommended serving sizes of common foods.  Discussed importance of controlled and consistent carbohydrate intake throughout the day. Provided examples of ways to balance meals/snacks and encouraged intake of high-fiber, whole grain complex carbohydrates. Teach back method used.  Expect good compliance.  Body mass index is 27.86 kg/m. Pt meets criteria for overweight based on current BMI.  Current diet order is Heart Healthy/Carb Modified, patient is consuming approximately 100% of meals at this time. Labs and medications reviewed. No further nutrition interventions warranted at this time. RD contact information provided. If additional nutrition issues arise, please re-consult RD.   Earma Reading, MS, RD, LDN Pager: [phone] Weekend/After Hours: [phone]

## 2018-08-05 NOTE — Progress Notes (Signed)
Family Medicine Teaching Service Daily Progress Note Intern Pager: [phone]  Patient name: Victor Moreno Medical record number: [id number] Date of birth: [birth year] Age: 57 y.o. Gender: male  Primary Care Provider: System, Pcp Not In Consultants: Neurolgoy, PMR, Case management  Code Status: Full code  Pt Overview and Major Events to Date:  Hospital Day 2 Admitted: 08/02/2018  Assessment and Plan: Victor Moreno is a right-handed 57 y.o. male presenting with slurred speech, right-sided weakness. PMH is significant for T2DM, HLD.  Patient denies any other medical problems.  Medical records from his primary care provider have yet to be received.  #CVA Patient reported return of right-sided weakness on Friday evening which have continued through this morning.  This morning he reports that this is the worst his symptoms have been throughout this episode.  This morning patient has worsened right-sided weakness as compared to last exam on Friday evening.  And has appeared to lose right upper extremity motor.  Studies ? Echo: Grade 1 diastolic dysfunction, EF 60 to 65% ? Carotid Dopplers: Left and right carotids with 1 to 39% stenosis ? MRI/MRA head and neck, acute/early subacute infarction of left posterior limb of internal capsule.  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  Mild volume loss.  Anterior and posterior circulation.  No large vessel occlusion aneurysm or stenosis.  Moderate bilateral P2 stenosis and bilateral ICA atherosclerosis ? CTA H/N: Small internal capsule infarct appears more conspicuous.  Small infarcts appears roughly stable in comparison to MRI.  Chronic white matter changes.  Repeat swallow study with return of left-sided facial weakness  Dual antiplatelet therapy with Plavix 75 and 325 aspirin for 3 weeks.   Disposition: Patient and patient's family would like to receive outpatient therapy in [place].   Inpatient admissions coordinator signed off. ? Neurology signed off and patient will follow-up in stroke clinic NP at [facility] in about 4 weeks. ? Appreciate PT OT recs as patient is not going to be seen at CIR ? Likely discharge tomorrow with confirmed outpatient follow-up and Case management follow-up for medication barriers after discharge  #Hypertension  Patient reports that he has never been diagnosed with hypertension. Over the weekend his blood pressure trended to the 140s and 50s systolic.  We will continue to monitor blood pressures.    Low threshold to add  lisinopril  #T2DM:  Patient's blood sugars continue to be elevated.  This morning his fasting glucose was 212.  Last 2 point-of-care CBGs were 196 and 176.  His creatinine is currently stable at 0.69  Patient's A1c is 12  Increase Lantus to 19 units nightly NovoLog 7 units 3 times daily WC.  Continue sensitive sliding scale and bedtime coverage.   Will consider 10 you to monitor his blood glucose levels and adjust medications as necessary  Per diabetes coordinator's note, patient will need help with outpatient management and medication costs.  Close follow-up with PCP  #Severe HLD: Takes pravastatin 40mg  daily. Triglycerides 1300, LDL unable to calculate.  Patient switched to rosuvastatin and fenofibrate over the weekend.  Triglycerides likely severely elevated secondary to poorly controlled diabetes.  Can follow-up on repeat LDL in several weeks diabetic management.  #Peripheral Neuropathy:  Currently stable, requires no medication.  Nutrition: P.o. GI ppx: PEG DVT px: Lovenox  Disposition: [place] for rehab as closer to family for care at home. No insurance for CIR. Need to ensure close PCP follow up with diabetes and HLD as patient has poor  compliance 2/2 to access issues.  Subjective:  Patient is sleeping this morning.  He reports that he is doing well but that this is the worst symptoms that he has had for this  entire episode.  He does not believe that his slurred speech has worsened.  He is tearful upon exam because he has lost motor function of his right upper extremity.  Patient reports that he has not had a bowel movement in several days.  Objective: Temp:  [97.8 F (36.6 C)-98.6 F (37 C)] 97.8 F (36.6 C) (09/09 0404) Pulse Rate:  [68-84] 71 (09/09 0800) Resp:  [16-22] 16 (09/09 0404) BP: (120-148)/(66-90) 122/73 (09/09 0800) SpO2:  [93 %-99 %] 98 % (09/09 0800) Intake/Output 09/08 0701 - 09/09 0700 In: 1260 [P.O.:1260] Out: 275 [Urine:275] Physical Exam:  Gen: NAD, alert, non-toxic, well-nourished, well-appearing, sitting comfortably  HEENT: Normocephaic, atraumatic. Clear conjuctiva, no scleral icterus and injection.  CV: Regular rate and rhythm.  Normal S1-S2.   Normal capillary refill bilaterally.  Radial pulses 2+ bilaterally. No bilateral lower extremity edema. Resp: Clear to auscultation bilaterally.  No wheezing, rales, abnormal lung sounds.  No increased work of breathing appreciated. Abd: Nontender and nondistended on palpation to all 4 quadrants.  Tympanic to percussion.  No fluid wave.  Positive bowel sounds.  Psych: Cooperative with exam. Pleasant. Makes eye contact. Tearful.  Neurology: Strength 0 out of 5 in right upper extremity.  Strength 5 out of 5 in left upper extremity.  Strength 2 out of 5 in right lower extremity and strength 5 out of 5 in left lower extremity.  Right facial droop appreciated.  Patient is slurring words.  He has no loss of sensation in his face bilaterally.  No deviation of tongue.  Patient able to puff cheeks.  Smile is asymmetrical.  Slowly reactive right pupil.   Laboratory: Recent Labs  Lab 08/02/18 1241 08/02/18 1243  WBC 8.6  --   HGB 15.6 15.3  HCT 45.3 45.0  PLT 211  --    Recent Labs  Lab 08/02/18 1241 08/02/18 1243 08/04/18 0316 08/05/18 0700  NA 131* 130* 134* 137  K 4.2 4.2 3.7 3.7  CL 95* 99 99 101  CO2 23  --  26 25   BUN 28* 32* 19 18  CREATININE 0.84 0.80 0.76 0.69  CALCIUM 9.8  --  9.0 8.9  PROT 6.9  --   --   --   BILITOT 2.0*  --   --   --   ALKPHOS 96  --   --   --   ALT 40  --   --   --   AST 23  --   --   --   GLUCOSE 440* 457* 259* 212*    Melene Plan, MD 08/05/2018, 8:09 AM PGY-1, [facility] Family Medicine FPTS Intern pager: [phone], text pages welcome

## 2018-08-05 NOTE — Progress Notes (Signed)
Physical Therapy Treatment Patient Details Name: Victor Moreno MRN: [id number] DOB: [birth year] Today's Date: 08/05/2018    History of Present Illness Victor Moreno is a right-handed 57 y.o. male presenting with slurred speech, right-sided weakness. PMH is significant for T2DM, HLD. Progression of symptoms overnight during. 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    PT Comments    Patient seen for mobility progression. Pt presents with R side weakness and inattention and requires min/mod A for functional transfers and gait training. Pt is motivated to participate in therapy and is a good candidate for CIR level therapies to maximize independence and safety with mobility.    Follow Up Recommendations  CIR     Equipment Recommendations  (TBD)    Recommendations for Other Services Rehab consult     Precautions / Restrictions Precautions Precautions: Fall Restrictions Weight Bearing Restrictions: No    Mobility  Bed Mobility Overal bed mobility: Needs Assistance Bed Mobility: Supine to Sit     Supine to sit: Min guard     General bed mobility comments: use of rail; min guard for safety; increased time and effort needed  Transfers Overall transfer level: Needs assistance Equipment used: 1 person hand held assist Transfers: Sit to/from Stand Sit to Stand: Min assist         General transfer comment: assistance for balance   Ambulation/Gait Ambulation/Gait assistance: Mod assist Gait Distance (Feet): 120 Feet Assistive device: 1 person hand held assist Gait Pattern/deviations: Step-through pattern;Decreased stride length;Ataxic;Staggering right Gait velocity: varying velocity   General Gait Details: cues required for gait speed; pt with R side inattention and requires cues for safety and protecting R UE when navigating environment; assistance for balance and weight shifting especially with horizontal head turns as pt tends to lose  balance to R side    Stairs             Wheelchair Mobility    Modified Rankin (Stroke Patients Only) Modified Rankin (Stroke Patients Only) Pre-Morbid Rankin Score: Slight disability Modified Rankin: Moderately severe disability     Balance Overall balance assessment: Needs assistance Sitting-balance support: Single extremity supported;Feet supported Sitting balance-Leahy Scale: Fair     Standing balance support: No upper extremity supported Standing balance-Leahy Scale: Poor Standing balance comment: fair static, poor dynamic                            Cognition Arousal/Alertness: Awake/alert Behavior During Therapy: WFL for tasks assessed/performed Overall Cognitive Status: Within Functional Limits for tasks assessed                                 General Comments: R side inattention       Exercises      General Comments        Pertinent Vitals/Pain Pain Assessment: No/denies pain    Home Living                      Prior Function            PT Goals (current goals can now be found in the care plan section) Progress towards PT goals: Progressing toward goals    Frequency    Min 4X/week      PT Plan Current plan remains appropriate    Co-evaluation  AM-PAC PT "6 Clicks" Daily Activity  Outcome Measure  Difficulty turning over in bed (including adjusting bedclothes, sheets and blankets)?: Unable Difficulty moving from lying on back to sitting on the side of the bed? : Unable Difficulty sitting down on and standing up from a chair with arms (e.g., wheelchair, bedside commode, etc,.)?: Unable Help needed moving to and from a bed to chair (including a wheelchair)?: A Little Help needed walking in hospital room?: A Lot Help needed climbing 3-5 steps with a railing? : A Lot 6 Click Score: 10    End of Session Equipment Utilized During Treatment: Gait belt Activity Tolerance: Patient  tolerated treatment well Patient left: in chair;with call bell/phone within reach;with chair alarm set Nurse Communication: Mobility status PT Visit Diagnosis: Unsteadiness on feet (R26.81);Ataxic gait (R26.0);Difficulty in walking, not elsewhere classified (R26.2);Other symptoms and signs involving the nervous system (R29.898)     Time: 4098-1191 PT Time Calculation (min) (ACUTE ONLY): 25 min  Charges:  $Gait Training: 8-22 mins $Therapeutic Activity: 8-22 mins                     Erline Levine, PTA Pager: [phone]     Carolynne Edouard 08/05/2018, 12:04 PM

## 2018-08-05 NOTE — Consult Note (Signed)
Physical Medicine and Rehabilitation Consult Reason for Consult: Right side weakness and slurred speech Referring Physician: Family medicine   HPI: Victor Moreno is a 57 y.o. right-handed male with history of hyperlipidemia, diabetes mellitus with peripheral neuropathy.  Per chart review, patient lives alone.  Independent prior to admission and works full-time.  He plans to stay with his 32 year old mother on discharge who can assist but limited physically.  Presented 08/02/2018 with right-sided weakness and slurred speech.  Cranial CT reviewed, unremarkable for acute intracranial process. CT angiogram of head and neck negative for emergent large vessel occlusion.  Patient did not receive TPA.  MRI/MRA showed 8 mm focus of acute early subacute infarction within the left posterior limb of internal capsule.  Multiple very small chronic infarctions present bilateral frontal periventricular white matter.  Moderate bilateral P2 stenosis.  Echocardiogram with ejection fraction of 65% grade 1 diastolic dysfunction.  Carotid Dopplers with no ICA stenosis.  Neurology service is consulted maintained on aspirin and Plavix therapy x3 weeks then aspirin alone.  Patient with reports of increased right side weakness 08/03/2018 follow-up scans appeared stable.  Hemoglobin A1c 12.3 with insulin therapy as directed.  Subcutaneous Lovenox for DVT prophylaxis.  Tolerating a regular diet.  Latest therapy evaluation 08/04/2018 with recommendations of physical medicine rehab consult.  Review of Systems  Constitutional: Negative for chills and fever.  HENT: Negative for hearing loss.   Eyes: Negative for blurred vision and double vision.  Respiratory: Negative for cough and shortness of breath.   Cardiovascular: Negative for chest pain, palpitations and leg swelling.  Gastrointestinal: Positive for constipation. Negative for nausea and vomiting.  Genitourinary: Negative for dysuria, flank pain and hematuria.    Musculoskeletal: Positive for myalgias.  Skin: Negative for rash.  Neurological: Positive for speech change and focal weakness.  All other systems reviewed and are negative.  Past Medical History:  Diagnosis Date  . Diabetes mellitus without complication (HCC)   . High cholesterol    No past surgical history. No pertinent family history of premature CVA. Social History:  reports that he has never smoked. He has never used smokeless tobacco. He reports that he drank alcohol. He reports that he does not use drugs. Allergies:  Allergies  Allergen Reactions  . Bee Venom Anaphylaxis   Medications Prior to Admission  Medication Sig Dispense Refill  . Empagliflozin-linaGLIPtin (GLYXAMBI) 25-5 MG TABS Take 1 tablet by mouth daily.    . insulin glargine (LANTUS) 100 UNIT/ML injection Inject 10 Units into the skin daily.    . pravastatin (PRAVACHOL) 40 MG tablet Take 40 mg by mouth daily.      Home: Home Living Family/patient expects to be discharged to:: Private residence Living Arrangements: Alone Available Help at Discharge: Family, Available PRN/intermittently(mom and brother) Type of Home: House Home Access: Stairs to enter Bathroom Shower/Tub: Engineer, manufacturing systems: Standard  Functional History: Prior Function Level of Independence: Independent(Works, drives) Functional Status:  Mobility: Bed Mobility Overal bed mobility: Needs Assistance Bed Mobility: Supine to Sit Supine to sit: Min assist General bed mobility comments: Min assist to transition trunk to EOB and upright. Assist for hip rotation and max cues for positoning Transfers Overall transfer level: Needs assistance Equipment used: 1 person hand held assist Transfers: Sit to/from Stand Sit to Stand: Min assist General transfer comment: Min assist for stability during power to upright and in standing. Noted posterior list with LEs resting against bed despite physical  assist Ambulation/Gait Ambulation/Gait assistance: Min  assist, Mod assist Gait Distance (Feet): 60 Feet Assistive device: 1 person hand held assist Gait Pattern/deviations: Step-through pattern, Decreased stride length, Ataxic, Staggering right General Gait Details: patient with poor ability to coordinate steps and maintain balance. Several overt LOB with significantly limited ability to control velocity. Patient running into walls and objects. Moderate physical assist to prevent falls and establish functional gait. Gait velocity: varying velocity    ADL: ADL Overall ADL's : Needs assistance/impaired Eating/Feeding: Sitting, Moderate assistance Grooming: Moderate assistance Upper Body Bathing: Sitting, Moderate assistance Lower Body Bathing: Moderate assistance, Sit to/from stand Lower Body Bathing Details (indicate cue type and reason): Able to position right foot onto left knee. Assist to load sock onto foot. Pt able to finish task using L hand Upper Body Dressing : Moderate assistance, Sitting Lower Body Dressing: Sit to/from stand, Moderate assistance Toilet Transfer: Moderate assistance, Ambulation Toilet Transfer Details (indicate cue type and reason): 1 LOB to the right while standing to void. Assist to prevent fall. Toileting- Clothing Manipulation and Hygiene: Moderate assistance, Sit to/from stand Tub/ Shower Transfer: Moderate assistance, Ambulation Functional mobility during ADLs: Minimal assistance, Moderate assistance General ADL Comments: Pt completed bed mobility, in-room functional mobility, voided standing at toilet (1 LOB to the right), asssist to correct.  Cognition: Cognition Overall Cognitive Status: Within Functional Limits for tasks assessed Orientation Level: Oriented X4 Cognition Arousal/Alertness: Awake/alert Behavior During Therapy: WFL for tasks assessed/performed Overall Cognitive Status: Within Functional Limits for tasks assessed  Blood pressure  132/66, pulse 68, temperature 97.8 F (36.6 C), temperature source Oral, resp. rate 16, height 5' 9.25" (1.759 m), weight 86.2 kg, SpO2 99 %. Physical Exam  Vitals reviewed. Constitutional: He is oriented to person, place, and time. He appears well-developed and well-nourished.  HENT:  Head: Normocephalic.  Eyes: EOM are normal. Right eye exhibits no discharge. Left eye exhibits no discharge.  Neck: Normal range of motion. Neck supple. No thyromegaly present.  Cardiovascular: Normal rate, regular rhythm and normal heart sounds.  Respiratory: Effort normal and breath sounds normal. No respiratory distress.  GI: Soft. Bowel sounds are normal. He exhibits no distension.  Musculoskeletal:  No edema or tenderness in extremities  Neurological: He is alert and oriented to person, place, and time.  Follows full commands.   Fair awareness of deficits.   Speech is dysarthric but intelligible Motor: Right upper extremity: 0/5 proximal distal Left upper/lower extremity: 5/5 proximal distal Right lower extremity: Hip flexion, knee extension 4/5, ankle dorsiflexion 4+/5 Sensation intact light touch Facial droop  Skin: Skin is warm and dry.  Psychiatric: His speech is delayed. He is slowed.    Results for orders placed or performed during the hospital encounter of 08/02/18 (from the past 24 hour(s))  Glucose, capillary     Status: Abnormal   Collection Time: 08/04/18  6:29 AM  Result Value Ref Range   Glucose-Capillary 363 (H) 70 - 99 mg/dL   Comment 1 Notify RN   Glucose, capillary     Status: Abnormal   Collection Time: 08/04/18 11:20 AM  Result Value Ref Range   Glucose-Capillary 334 (H) 70 - 99 mg/dL   Comment 1 Notify RN    Comment 2 Document in Chart   Glucose, capillary     Status: Abnormal   Collection Time: 08/04/18  4:52 PM  Result Value Ref Range   Glucose-Capillary 313 (H) 70 - 99 mg/dL   Comment 1 Notify RN    Comment 2 Document in Chart   Glucose, capillary  Status:  Abnormal   Collection Time: 08/04/18  9:50 PM  Result Value Ref Range   Glucose-Capillary 176 (H) 70 - 99 mg/dL   Comment 1 Notify RN    Comment 2 Document in Chart    Ct Head Wo Contrast  Result Date: 08/03/2018 CLINICAL DATA:  Right handed weakness.  Slurred speech. EXAM: CT HEAD WITHOUT CONTRAST TECHNIQUE: Contiguous axial images were obtained from the base of the skull through the vertex without intravenous contrast. COMPARISON:  MRI August 02, 2018.  CT scan August 02, 2018. FINDINGS: Brain: No subdural, epidural, or subarachnoid hemorrhage identified. The known acute infarct in the white matter of the left internal capsule, posterior limb, is more pronounced on today's CT imaging compared to the August 02, 2018 CT scan and correlates with recent MRI findings. Other white matter changes identified. A frontal lobe lacunar infarct on the right is stable. No other acute infarct or ischemia identified. No mass effect or midline shift. Cerebellum, brainstem, and basal cisterns are normal. Vascular: Calcified atherosclerosis in the intracranial carotids. Skull: Normal. Negative for fracture or focal lesion. Sinuses/Orbits: No acute finding. Other: None. IMPRESSION: 1. The known infarct in the posterior limb of the left internal capsule seen on recent MRI is more conspicuous on today's CT scan than the August 02, 2018 CT scan. This small infarct appears roughly stable in size compared to the comparison MRI. Chronic white matter changes. No other acute abnormalities identified. Electronically Signed   By: Gerome Sam III M.D   On: 08/03/2018 07:13    Assessment/Plan: Diagnosis: acute early subacute infarction within the left posterior limb of internal capsule Labs and images (see above) independently reviewed.  Records reviewed and summated above. Stroke: Continue secondary stroke prophylaxis and Risk Factor Modification listed below:   Antiplatelet therapy:   Blood Pressure Management:   Continue current medication with prn's with permisive HTN per primary team Statin Agent:   Diabetes management:   Right sided hemiparesis: fit for orthosis to prevent contractures (resting hand splint for day, wrist cock up splint at night, PRAFO, etc) Motor recovery: Fluoxetine  1. Does the need for close, 24 hr/day medical supervision in concert with the patient's rehab needs make it unreasonable for this patient to be served in a less intensive setting? Yes  2. Co-Morbidities requiring supervision/potential complications: diastolic dysfunction (monitor for signs and symptoms of fluid overload), hyperlipidemia, diabetes mellitus with peripheral neuropathy (Monitor in accordance with exercise and adjust meds as necessary) 3. Due to bladder management, safety, disease management, medication administration and patient education, does the patient require 24 hr/day rehab nursing? Yes 4. Does the patient require coordinated care of a physician, rehab nurse, PT (1-2 hrs/day, 5 days/week), OT (1-2 hrs/day, 5 days/week) and SLP (1-2 hrs/day, 5 days/week) to address physical and functional deficits in the context of the above medical diagnosis(es)? Yes Addressing deficits in the following areas: balance, endurance, locomotion, strength, transferring, bowel/bladder control, bathing, dressing, grooming, toileting, speech and psychosocial support 5. Can the patient actively participate in an intensive therapy program of at least 3 hrs of therapy per day at least 5 days per week? Yes 6. The potential for patient to make measurable gains while on inpatient rehab is excellent 7. Anticipated functional outcomes upon discharge from inpatient rehab are supervision  with PT, supervision and min assist with OT, modified independent with SLP. 8. Estimated rehab length of stay to reach the above functional goals is: 14-18 days. 9. Anticipated D/C setting: Home 10. Anticipated post  D/C treatments: HH therapy and Home  excercise program 11. Overall Rehab/Functional Prognosis: good  RECOMMENDATIONS: This patient's condition is appropriate for continued rehabilitative care in the following setting: CIR adequate caregiver support available on discharge. Patient has agreed to participate in recommended program. Yes Note that insurance prior authorization may be required for reimbursement for recommended care.  Comment: Rehab Admissions Coordinator to follow up.  I have personally performed a face to face diagnostic evaluation, including, but not limited to relevant history and physical exam findings, of this patient and developed relevant assessment and plan.  Additionally, I have reviewed and concur with the physician assistant's documentation above.   Maryla Morrow, MD, ABPMR Mcarthur Rossetti Angiulli, PA-C 08/05/2018

## 2018-08-05 NOTE — Progress Notes (Signed)
Patient's family is at the bedside, patient and family believe patient will be discharged today. Family Medicine paged and made aware that case management needs outpatient PT/OT orders. Awaiting response.

## 2018-08-05 NOTE — Evaluation (Signed)
Speech Language Pathology Evaluation Patient Details Name: Victor Moreno MRN: [id number] DOB: March 09, 1961 Today's Date: 08/05/2018 Time: 7829-5621 SLP Time Calculation (min) (ACUTE ONLY): 37 min  Problem List:  Patient Active Problem List   Diagnosis Date Noted  . Acute ischemic stroke (HCC)   . Diastolic dysfunction   . Dyslipidemia   . Diabetic peripheral neuropathy (HCC)   . Hypertriglyceridemia 08/04/2018  . Type 2 diabetes mellitus treated with insulin (HCC) 08/04/2018  . Essential hypertension 08/04/2018  . CVA (cerebral vascular accident) (HCC) 08/02/2018   Past Medical History:  Past Medical History:  Diagnosis Date  . Diabetes mellitus without complication (HCC)   . High cholesterol    Past Surgical History: History reviewed. No pertinent surgical history. HPI:  Victor Moreno is a 57 year old right-handed male with history of hyperlipidemia, diabetes mellitus.  He lives alone was independent working full-time prior to admission.  Plans to stay with his 31 year old mother on discharge who can assist but limited physically.  Presented 08/02/2018 with acute right-sided weakness and slurred speech.  Cranial CT scan reviewed, unremarkable for acute intracranial process.  CT angiogram of head and neck negative for emergent large vessel occlusion.  Patient did not receive TPA.  MRI/MRA showed 8 mm focus of acute early subacute infarction within the left posterior limb of internal capsule.  Multiple very small chronic infarctions present bilateral frontal periventricular white matter.  Moderate bilateral P2 stenosis.  Echocardiogram with ejection fraction 65% grade 1 diastolic dysfunction.  Carotid Dopplers with no ICA stenosis.     Assessment / Plan / Recommendation Clinical Impression  Patient presented to hospital with right sided weakness and slurred speech. MRI showed 8 mm focus of early subacute infarction within the left posterior limb of internal capsule. Pt presents today  with mild to moderate dysarthria. Patient's mother and brother report his speech has improved a lot since admission. He is compensating by slowing his speech and  over pronunicatlng words. He is aware of mistakes and spontaneously self corrects. His cognition and language appear to be at baseline. Family are requestion patient to discharge to his mothers home in [place], [place] and receive outpatient therapy in [place]. Recommend patient receive speech therapy evaluation for therapy to address Dysarthria upon discharge.     SLP Assessment  SLP Recommendation/Assessment: All further Speech Lanaguage Pathology  needs can be addressed in the next venue of care SLP Visit Diagnosis: Dysarthria and anarthria (R47.1)    Follow Up Recommendations       Frequency and Duration           SLP Evaluation Cognition  Overall Cognitive Status: Within Functional Limits for tasks assessed Arousal/Alertness: Awake/alert Orientation Level: Oriented X4 Memory: Appears intact Awareness: Appears intact Problem Solving: Appears intact Executive Function: Reasoning Safety/Judgment: Appears intact       Comprehension  Auditory Comprehension Overall Auditory Comprehension: Appears within functional limits for tasks assessed Yes/No Questions: Within Functional Limits Commands: Within Functional Limits Conversation: Complex Visual Recognition/Discrimination Discrimination: Within Function Limits Reading Comprehension Reading Status: Within funtional limits    Expression Expression Primary Mode of Expression: Verbal Verbal Expression Overall Verbal Expression: Appears within functional limits for tasks assessed Initiation: No impairment Level of Generative/Spontaneous Verbalization: Conversation Repetition: No impairment Naming: No impairment Pragmatics: No impairment Written Expression Dominant Hand: Right Written Expression: Not tested   Oral / Motor  Oral Motor/Sensory Function Overall Oral  Motor/Sensory Function: Moderate impairment Facial ROM: Reduced right Facial Symmetry: Abnormal symmetry right Facial Strength: Reduced right Facial  Sensation: Reduced right Lingual ROM: Reduced right Lingual Symmetry: Abnormal symmetry right Lingual Strength: Reduced Lingual Sensation: Reduced Velum: Within Functional Limits Mandible: Within Functional Limits Motor Speech Overall Motor Speech: Impaired Respiration: Within functional limits Phonation: Normal Resonance: Within functional limits Articulation: Impaired Intelligibility: Intelligibility reduced Word: 50-74% accurate Phrase: 50-74% accurate Sentence: 50-74% accurate Conversation: 50-74% accurate Motor Planning: Witnin functional limits Motor Speech Errors: Aware   GO                   Victor Hose Juneau Doughman, MA, CCC-SLP 08/05/2018 3:43 PM

## 2018-08-05 NOTE — Progress Notes (Signed)
Inpatient Rehabilitation-Admissions Coordinator   Spoke with pt and family at the bedside (pt's mother and brother present) as follow up from PM&R consult. After lengthy discussion, pt and family would like outpatient therapy in [place], [place] as opposed to CIR due to commute and potential financial cost. [facility] relayed information to CM and SW. [facility] will sign off at this time. Please call if questions.   Nanine Means, OTR/L  Rehab Admissions Coordinator  [phone] 08/05/2018 2:55 PM

## 2018-08-06 LAB — GLUCOSE, CAPILLARY
Glucose-Capillary: 236 mg/dL — ABNORMAL HIGH (ref 70–99)
Glucose-Capillary: 238 mg/dL — ABNORMAL HIGH (ref 70–99)
Glucose-Capillary: 167 mg/dL — ABNORMAL HIGH (ref 70–99)

## 2018-08-06 MED ORDER — LISINOPRIL 5 MG PO TABS: 5.0000 mg | ORAL_TABLET | Freq: Every day | ORAL | 0 refills | Status: AC

## 2018-08-06 MED ORDER — ASPIRIN 81 MG PO TBEC: 81.0000 mg | DELAYED_RELEASE_TABLET | Freq: Every day | ORAL | 0 refills | Status: AC

## 2018-08-06 MED ORDER — METFORMIN HCL 500 MG PO TABS: 500.0000 mg | ORAL_TABLET | Freq: Two times a day (BID) | ORAL | 0 refills | Status: AC

## 2018-08-06 MED ORDER — FENOFIBRATE 160 MG PO TABS: 160.0000 mg | ORAL_TABLET | Freq: Every day | ORAL | 0 refills | Status: AC

## 2018-08-06 MED ORDER — INSULIN ISOPHANE & REGULAR (HUMAN 70-30)100 UNIT/ML KWIKPEN: PEN_INJECTOR | SUBCUTANEOUS | 0 refills | Status: AC

## 2018-08-06 MED ORDER — ROSUVASTATIN CALCIUM 20 MG PO TABS: 20.0000 mg | ORAL_TABLET | Freq: Every day | ORAL | 0 refills | Status: AC

## 2018-08-06 MED ORDER — CLOPIDOGREL BISULFATE 75 MG PO TABS: 75.0000 mg | ORAL_TABLET | Freq: Every day | ORAL | 0 refills | Status: AC

## 2018-08-06 NOTE — Progress Notes (Signed)
Physical Therapy Treatment Patient Details Name: Victor Moreno MRN: [id number] DOB: Oct 12, 1961 Today's Date: 08/06/2018    History of Present Illness Victor Moreno is a right-handed 57 y.o. male presenting with slurred speech, right-sided weakness. PMH is significant for T2DM, HLD. Progression of symptoms overnight during. 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    PT Comments    Pt seen for mobility progression. This session focused grossly on gait training and use of AD. Pt requires mod A and mod/max cues for safe use of AD. Pt with LOB using hemi walker requiring increased assistance to recover. Given pt's current mobility level he is at risk for falls and will continue to benefit from further skilled PT services in both acute and post acute settings. Pt prefers to return home with assistance of 79 yo mother. In the event pt returns home he will need 24 hour supervision/assistance and use of w/c for safe mobilization.    Follow Up Recommendations  CIR     Equipment Recommendations  (TBD)    Recommendations for Other Services Rehab consult     Precautions / Restrictions Precautions Precautions: Fall    Mobility  Bed Mobility Overal bed mobility: Needs Assistance Bed Mobility: Sit to Supine     Supine to sit: Mod assist;Min assist Sit to supine: Min assist   General bed mobility comments: assist to bring R LE into bed  and for better positioning  Transfers Overall transfer level: Needs assistance Equipment used: 1 person hand held assist Transfers: Sit to/from Stand Sit to Stand: Min assist;Mod assist Stand pivot transfers: Min assist;Mod assist       General transfer comment: assist to power up into standing and increased assistance to maintain balance in standing  Ambulation/Gait Ambulation/Gait assistance: Mod assist Gait Distance (Feet): 120 Feet Assistive device: Rolling walker (2 wheeled);Hemi-walker Gait  Pattern/deviations: Step-through pattern;Decreased stride length;Ataxic;Drifts right/left Gait velocity: varying velocity   General Gait Details: attempted gait with RW and hemi walker this session; pt is unable to maintain R hand grip for safe use of RW and pt with unsafe use of hemi walker without mod/max cues; pt with LOB and requires assistance to recover due to difficulty advancing R LE   Stairs             Wheelchair Mobility    Modified Rankin (Stroke Patients Only) Modified Rankin (Stroke Patients Only) Pre-Morbid Rankin Score: Slight disability Modified Rankin: Moderately severe disability     Balance Overall balance assessment: Needs assistance Sitting-balance support: Single extremity supported;Feet supported Sitting balance-Leahy Scale: Fair Sitting balance - Comments: able to maintain static sitting with min guard assist.  Loses balance with dynamic challenges    Standing balance support: During functional activity Standing balance-Leahy Scale: Poor Standing balance comment: Pt requires close min guard assist to min A to maintain static balance.  Min - mod A for dynamic standing balance                             Cognition Arousal/Alertness: Awake/alert Behavior During Therapy: WFL for tasks assessed/performed Overall Cognitive Status: Impaired/Different from baseline Area of Impairment: Safety/judgement;Awareness;Problem solving                         Safety/Judgement: Decreased awareness of safety Awareness: Emergent Problem Solving: Difficulty sequencing;Requires verbal cues General Comments: Pt is mildly impulsive.  He attempts to move without  fully planning and anticipating difficulties which places him at risk for falls       Exercises Other Exercises Other Exercises: Pt instructed in lap slides with good control and alignment of Rt shoulder as he tends to move UE quickly and forefully in order to achieve big movement.   He  requires mod cues to perform HEP in controlled manner     General Comments General comments (skin integrity, edema, etc.): Pt is insistent on going home instead of CIR.   He lives with 55 y.o. mother who will be primary caregiver, and his 53 y.o. brother will be in and out to assist.   Discussed my fear of him falling, and potentially causing his mother to fall with pt.   He is open to possibility of using w/c at home until he is moving better and is at lower risk for falls       Pertinent Vitals/Pain Pain Assessment: No/denies pain    Home Living                      Prior Function            PT Goals (current goals can now be found in the care plan section) Progress towards PT goals: Progressing toward goals    Frequency    Min 4X/week      PT Plan Current plan remains appropriate    Co-evaluation              AM-PAC PT "6 Clicks" Daily Activity  Outcome Measure  Difficulty turning over in bed (including adjusting bedclothes, sheets and blankets)?: Unable Difficulty moving from lying on back to sitting on the side of the bed? : Unable Difficulty sitting down on and standing up from a chair with arms (e.g., wheelchair, bedside commode, etc,.)?: Unable Help needed moving to and from a bed to chair (including a wheelchair)?: A Little Help needed walking in hospital room?: A Lot Help needed climbing 3-5 steps with a railing? : A Lot 6 Click Score: 10    End of Session Equipment Utilized During Treatment: Gait belt Activity Tolerance: Patient tolerated treatment well Patient left: with call bell/phone within reach;in bed;with bed alarm set Nurse Communication: Mobility status PT Visit Diagnosis: Unsteadiness on feet (R26.81);Ataxic gait (R26.0);Difficulty in walking, not elsewhere classified (R26.2);Other symptoms and signs involving the nervous system (R29.898)     Time: 1400-1433 PT Time Calculation (min) (ACUTE ONLY): 33 min  Charges:  $Gait  Training: 23-37 mins                     Erline Levine, PTA Acute Rehabilitation Services Pager: [phone]     Carolynne Edouard 08/06/2018, 2:52 PM

## 2018-08-06 NOTE — Plan of Care (Signed)
  Problem: Education: Goal: Knowledge of disease or condition will improve Outcome: Progressing   Problem: Coping: Goal: Will verbalize positive feelings about self Outcome: Progressing   Problem: Self-Care: Goal: Ability to participate in self-care as condition permits will improve Outcome: Progressing   

## 2018-08-06 NOTE — Progress Notes (Signed)
Occupational Therapy Treatment Patient Details Name: Victor Moreno MRN: [id number] DOB: 1961/01/30 Today's Date: 08/06/2018    History of present illness Victor Moreno is a right-handed 57 y.o. male presenting with slurred speech, right-sided weakness. PMH is significant for T2DM, HLD. Progression of symptoms overnight during. 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   OT comments  Pt requires min - mod A for all aspects for ADLs and functional mobility.  He is, at times, impulsive and is at risk for falls.  He has refused CIR and plans to return home with his 95 y.o. Mother who will be his primary caregiver, and 43 y.o brother will assist intermittently.  He plans to sleep on the couch at his mother's home.  He required extensive assist to move supine to sit without use of bed rails.  Recommend he utilize w/c for mobility until he is safer with ambulation.   He does feel his mother's home is accessible to w/c.  Family will be here this pm, and will try to meet with them for family education to ensure they feel they can safely manage him at home.  See below for recommendations   Follow Up Recommendations  CIR  - pt has refused.  Recommend OPOT, PT    Equipment Recommendations  3 in 1 bedside commode;Tub/shower bench;Wheelchair (measurements OT);Wheelchair cushion (measurements OT)    Recommendations for Other Services Rehab consult    Precautions / Restrictions Precautions Precautions: Fall       Mobility Bed Mobility Overal bed mobility: Needs Assistance Bed Mobility: Supine to Sit     Supine to sit: Mod assist;Min assist     General bed mobility comments: Pt will be sleeping on a couch.  He required max cues to sequence safe technique for moving supine to sit.   He almost slid off EOB once he did achieve full sitting (required 4 attempts before he could move to sitting as he was unsuccessul with other attempts   Transfers Overall transfer  level: Needs assistance Equipment used: 1 person hand held assist Transfers: Sit to/from [employer] Sit to Stand: Min assist;Mod assist Stand pivot transfers: Min assist;Mod assist       General transfer comment: Pt with heavy LOB to Rt with initial stand requiring mod A to recover.  Pt reports he feels he did not position feet appropriately.  On second attempt, pt was able to perform with very light min A to steady     Balance Overall balance assessment: Needs assistance Sitting-balance support: Feet supported Sitting balance-Leahy Scale: Fair Sitting balance - Comments: able to maintain static sitting with min guard assist.  Loses balance with dynamic challenges    Standing balance support: No upper extremity supported Standing balance-Leahy Scale: Poor Standing balance comment: Pt requires close min guard assist to min A to maintain static balance.  Min - mod A for dynamic standing balance                            ADL either performed or assessed with clinical judgement   ADL Overall ADL's : Needs assistance/impaired Eating/Feeding: Sitting;Moderate assistance           Lower Body Bathing: Moderate assistance;Sit to/from stand       Lower Body Dressing: Moderate assistance;Sit to/from stand Lower Body Dressing Details (indicate cue type and reason): requires assist to get sock started over Rt foot.  min -  mod A to stand as he is impulsive and often looses balance upon standing  Toilet Transfer: Moderate assistance;Ambulation;Comfort height toilet   Toileting- Clothing Manipulation and Hygiene: Moderate assistance;Sit to/from stand       Functional mobility during ADLs: Minimal assistance;Moderate assistance General ADL Comments: reinforced safety concerns with pt as he is at risk for falls.        Vision       Perception     Praxis      Cognition Arousal/Alertness: Awake/alert Behavior During Therapy: WFL for tasks  assessed/performed;Impulsive Overall Cognitive Status: Impaired/Different from baseline Area of Impairment: Safety/judgement;Awareness;Problem solving                         Safety/Judgement: Decreased awareness of safety Awareness: Emergent Problem Solving: Difficulty sequencing;Requires verbal cues General Comments: Pt is mildly impulsive.  He attempts to move without fully planning and anticipating difficulties which places him at risk for falls         Exercises Exercises: Other exercises Other Exercises Other Exercises: Pt instructed in lap slides with good control and alignment of Rt shoulder as he tends to move UE quickly and forefully in order to achieve big movement.   He requires mod cues to perform HEP in controlled manner    Shoulder Instructions       General Comments Pt is insistent on going home instead of CIR.   He lives with 68 y.o. mother who will be primary caregiver, and his 80 y.o. brother will be in and out to assist.   Discussed my fear of him falling, and potentially causing his mother to fall with pt.   He is open to possibility of using w/c at home until he is moving better and is at lower risk for falls     Pertinent Vitals/ Pain       Pain Assessment: No/denies pain  Home Living                                          Prior Functioning/Environment              Frequency  Min 3X/week        Progress Toward Goals  OT Goals(current goals can now be found in the care plan section)  Progress towards OT goals: Progressing toward goals     Plan Discharge plan remains appropriate    Co-evaluation                 AM-PAC PT "6 Clicks" Daily Activity     Outcome Measure   Help from another person eating meals?: A Little Help from another person taking care of personal grooming?: A Lot Help from another person toileting, which includes using toliet, bedpan, or urinal?: A Lot Help from another person bathing  (including washing, rinsing, drying)?: A Lot Help from another person to put on and taking off regular upper body clothing?: A Lot Help from another person to put on and taking off regular lower body clothing?: A Lot 6 Click Score: 13    End of Session Equipment Utilized During Treatment: Gait belt  OT Visit Diagnosis: Unsteadiness on feet (R26.81);Hemiplegia and hemiparesis Hemiplegia - Right/Left: Right Hemiplegia - dominant/non-dominant: Dominant   Activity Tolerance Patient tolerated treatment well;Patient limited by fatigue   Patient Left in chair;with call bell/phone within reach;with chair alarm  set   Nurse Communication Mobility status        Time: 0865-7846 OT Time Calculation (min): 38 min  Charges: OT General Charges $OT Visit: 1 Visit OT Treatments $Self Care/Home Management : 23-37 mins $Neuromuscular Re-education: 8-22 mins  Victor Moreno, OTR/L Acute Rehabilitation Services Pager [phone] Office [phone]    Victor Moreno M 08/06/2018, 11:55 AM

## 2018-08-06 NOTE — Discharge Instructions (Signed)
Dear Victor Moreno,   Thank you for letting us participate in your care! In this section, you will find a brief hospital admission summary of why you were admitted to the hospital, what happened, and any further follow up we think would benefit you and your health:   You were admitted to the hospital because you were experiencing right sided facial and arm and leg weakness.  You were found to have a stroke in a deep section of the left side of your brain.  Your symptoms at first decreased but then shortly increased and you lost the ability to move your right arm and had weakness in your right leg.  This likely occurred because of complications from diabetes type 2 and hyperlipidemia. You were started on medication for your diabetes and your discharged on diabetic medications that you could afford.  You were discharged with outpatient rehab in [place] and you preferred to continue seeing Dr. Lacie Scotts in [place].  Please make sure you follow-up with Dr. Lacie Scotts for diabetic and hyperlipidemia medications.  POST-HOSPITAL/FOLLOW-UP CARE INSTRUCTIONS Home instructions:  1. Please be careful with walking, as you are right leg is still weak. 2. Shows your wheelchair and 3 in 1 commode. 3. Diabetes Medication Instructions: Please take metformin 500 mg 2 times a day.  Also take your "insulin 70/30 pen". 20 units 10-15 minutes before breakfast and 10 units 10-15 minutes before dinner. Please do not take your insulin if you are not sure you will have a meal.  4. You have also been prescribed the medications you have been receiving in the hospital. Please continue to take these. These are listed in your discharge packet. I have printed each prescription separately. Nothing has been automatically sent to the pharmacy.  5. Please make sure you measure and record your blood glucose before eating in the mornings.  Take these records to your appointment with Dr. Lacie Scotts, as this will help him adjust her  medications. 6. It is important for you to take your blood sugars at home as you are starting a new insulin regimen and there are concerns with hypoglycemia.  This is when you take too much insulin and there is not enough sugar in your bloodstream for your body to work efficiently.  This causes you to become lightheaded, shaky, sweaty, and sometimes faint.  If you experience any symptoms, please drink juice high in sugar or have a high carbohydrate snack.  If your symptoms resolve, please make an appointment with your primary care physician as it may indicate that you need to change your insulin regimen.  If you still continue to feel symptoms after eating, please call 911 and go to your nearest emergency department.  Things to expect:  1. Metformin can cause GI upset and diarrhea.  Please continue to take metformin unless you are unable to tolerate and becoming dehydrated from excess diarrhea.  Doctors appointments & follow up:  No future appointments.  Thank you for choosing [facility]! Take care and be well!  Family Medicine Teaching Service Inpatient Team [place]  [facility]  [address] [place], [place] [address] [phone]

## 2018-08-06 NOTE — Progress Notes (Signed)
OT Cancellation Note  Patient Details Name: Victor Moreno MRN: [id number] DOB: [birth year]   Cancelled Treatment:    Reason Eval/Treat Not Completed: checked back for education with family, but no family present.  Pt now states "it'll be awhile" when asked when they will be here.   Jeani Hawking, OTR/L Acute Rehabilitation Services Pager [phone] Office [phone]  Jeani Hawking M 08/06/2018, 3:25 PM

## 2018-08-06 NOTE — Discharge Summary (Signed)
Family Medicine Teaching [facility] Discharge Summary  Patient name: Victor Moreno Medical record number: [id number] Date of birth: [birth year] Age: 57 y.o. Gender: male Date of Admission: 08/02/2018  Date of Discharge:   Admitting Physician: [name] Manners, MD  Primary Care Provider: Evelene Croon, MD Consultants: PT/OT, SLP, Neuro   Indication for Hospitalization: CVA  Discharge Diagnoses/Problem List:  CVA, poorly controlled Diabetes 2, Severe hyperlipidemia  Disposition:  Home with outpatient rehab  Discharge Condition: Stable  Discharge Exam:  BP 111/72 (BP Location: Right Arm)   Pulse 70   Temp 98.2 F (36.8 C) (Axillary)   Resp 18   Ht 5' 9.25" (1.759 m)   Wt 86.2 kg   SpO2 96%   BMI 27.86 kg/m   Gen: NAD, alert, non-toxic, well-nourished, well-appearing, sitting comfortably  HEENT: Normocephaic, atraumatic. Clear conjuctiva, no scleral icterus and injection.  Right facial droop.  CV: Regular rate and rhythm.  Normal S1-S2.   Normal capillary refill bilaterally.  Radial pulses 2+ bilaterally. No bilateral lower extremity edema. Resp: Clear to auscultation bilaterally. No increased work of breathing appreciated. Abd: Nontender and nondistended on palpation to all 4 quadrants.  Positive bowel sounds. Psych: Cooperative with exam. Pleasant. Makes eye contact. Not tearful  Neuro:  Patient with 5/5 right shoulder flexion, extension, 4/5 adduction. Lower extremity neuro unchanged. CN unchanged.   Brief Hospital Course:  ORR VISCOMI is a 57 y.o. male with past medical history significant for poorly controlled diabetes and severe hyperlipidemia, who presented with slurred speech, right-sided facial, lower and upper extremity weakness and found to have infarct in posterior limb of left internal capsule with small chronic infarctions present in bilateral frontal periventricular white matter with mild volume loss. Pertinent HPI points include: Patient was not  compliant with medications and had not been taking diabetic medication due to loss of insurance for 3 months prior to admission. Initial work up was significant for systolic blood pressure in 200s, A1c of 12.3, triglycerides greater than 1300, incalculable LDL.  Patient's hypertension trended to 140s and 50s and remained stable. Patient's PCP office was contacted shortly after admission, however it was late in Friday afternoon.  Basal and mealtime insulin were started and titrated as needed.  5 mg lisinopril was also added to medication regimen. Consulting providers included neurology. Recommendations were as follows: Optimization of medications for comorbidities increasing risk of CVA or further cardiovascular effects. Patient's HLD medications were swithced to rosuvastatin 20mg  and fenofibrate. He was started on dual anti-platelet therapy x 1 month. He should transition to monotherapy with ASA on September 5th.  Patient symptoms initially improved from time and EMS to later that evening and patient had only mild weakness of his left upper and lower extremity.  On the evening of admission, patient developed worsening symptoms and had no motor in his right upper extremity and weekend strength in lower extremity.  CT was repeated and no significant changes were found.  By the time of discharge, patient had regained proximal right upper extremity motor and was able to perform right shoulder flexion/extension, abduction/abduction.  PT and OT recommended inpatient rehabilitation, however patient would not be able to afford without insurance.  Patient and family decided on outpatient rehab and close follow-up with PCP while living with his mom in [place]. Patient also requested to continue see Dr. Lacie Scotts at [facility] and was agreeable to commuting from [place].  Mom will provide transportation to doctor's appointments and follow-ups. Patient also worked closely with case management for  difficulties with affording  medications.  Patient reports that he will get insurance again in late September.  In the meantime, he was discharged with printed prescriptions for:  insulin 70/30 with 20u with breakfast and 10u with dinner  Metformin  5mg  Lisinopril   .  He was also discharged with a wheelchair per PT recommendations.  Case management also assisted in obtaining outpatient PT and OT in [place], [place].   Issues for Follow Up:  1. Progress of PT/OT rehab 2. Medication affordability, and efficacy of regimen 3. Metformin started at 500mg  BID. Insulin 70/30, 20u AM and 10u PM WC, please titrate these medications as needed. Patient has been directed to start recording fasting blood sugars daily and bringing records to PCP appointments.  4. August 01, 2018- transition to mono antiplatelet therapy with ASA 81 only.   Significant Procedures:  Procedure Orders     ED EKG     EKG 12-Lead     ECHOCARDIOGRAM COMPLETE  Significant Labs and Imaging:  Recent Labs  Lab 08/02/18 1241 08/02/18 1243  WBC 8.6  --   HGB 15.6 15.3  HCT 45.3 45.0  PLT 211  --    Recent Labs  Lab 08/02/18 1241 08/02/18 1243 08/04/18 0316 08/05/18 0700  NA 131* 130* 134* 137  K 4.2 4.2 3.7 3.7  CL 95* 99 99 101  CO2 23  --  26 25  GLUCOSE 440* 457* 259* 212*  BUN 28* 32* 19 18  CREATININE 0.84 0.80 0.76 0.69  CALCIUM 9.8  --  9.0 8.9  ALKPHOS 96  --   --   --   AST 23  --   --   --   ALT 40  --   --   --   ALBUMIN 3.9  --   --   --     Ct Angio Head W Or Wo Contrast Result Date: 08/02/2018 IMPRESSION: 1. Negative for emergent large vessel occlusion 2. Moderate stenosis origin of the vertebral arteries bilaterally. Atherosclerotic disease of the carotid bifurcation left greater than right without hemodynamically significant stenosis 3. Mild to moderate stenosis distal ACA bilaterally. Moderate stenosis P2 segment bilaterally. Electronically Signed   By: Marlan Palau M.D.   On: 08/02/2018 13:16   Ct Head Wo  Contrast Result Date: 08/03/2018  IMPRESSION: 1. The known infarct in the posterior limb of the left internal capsule seen on recent MRI is more conspicuous on today's CT scan than the August 02, 2018 CT scan. This small infarct appears roughly stable in size compared to the comparison MRI. Chronic white matter changes. No other acute abnormalities identified. Electronically Signed   By: Gerome Sam III M.D   On: 08/03/2018 07:13   Ct Angio Neck W Or Wo Contrast Result Date: 08/02/2018 IMPRESSION: 1. Negative for emergent large vessel occlusion 2. Moderate stenosis origin of the vertebral arteries bilaterally. Atherosclerotic disease of the carotid bifurcation left greater than right without hemodynamically significant stenosis 3. Mild to moderate stenosis distal ACA bilaterally. Moderate stenosis P2 segment bilaterally. Electronically Signed   By: Marlan Palau M.D.   On: 08/02/2018 13:16   Mr Brain Wo Contrast Result Date: 08/02/2018 IMPRESSION: 1. 8 mm focus of acute/early subacute infarction within the left posterior limb of internal capsule. No associated hemorrhage or mass effect. 2. Multiple very small chronic infarctions are present in bilateral frontal periventricular white matter. Mild volume loss of the brain. 3. Patent anterior and posterior intracranial circulation. No large vessel occlusion, aneurysm,  or high-grade stenosis. 4. Moderate bilateral P2 stenosis. Bilateral ICA atherosclerosis with mild lumen irregularity, no high-grade stenosis. These results will be called to the ordering clinician or representative by the Radiologist Assistant, and communication documented in the PACS or zVision Dashboard. Electronically Signed   By: Mitzi Hansen M.D.   On: 08/02/2018 22:33   Mr Maxine Glenn Head Wo Contrast Result Date: 08/02/2018  IMPRESSION: 1. 8 mm focus of acute/early subacute infarction within the left posterior limb of internal capsule. No associated hemorrhage or mass effect. 2.  Multiple very small chronic infarctions are present in bilateral frontal periventricular white matter. Mild volume loss of the brain. 3. Patent anterior and posterior intracranial circulation. No large vessel occlusion, aneurysm, or high-grade stenosis. 4. Moderate bilateral P2 stenosis. Bilateral ICA atherosclerosis with mild lumen irregularity, no high-grade stenosis. These results will be called to the ordering clinician or representative by the Radiologist Assistant, and communication documented in the PACS or zVision Dashboard. Electronically Signed   By: Mitzi Hansen M.D.   On: 08/02/2018 22:33   Ct Head Code Stroke Wo Contrast Result Date: 08/02/2018  IMPRESSION: 1. No acute abnormality 2. ASPECTS is 10 3. These results were called by telephone at the time of interpretation on 08/02/2018 at 12:55 pm to Dr. Amada Jupiter, who verbally acknowledged these results. Electronically Signed   By: Marlan Palau M.D.   On: 08/02/2018 12:55   Discharge Medications:  Allergies as of 08/06/2018      Reactions   Bee Venom Anaphylaxis      Medication List    STOP taking these medications   GLYXAMBI 25-5 MG Tabs Generic drug:  Empagliflozin-linaGLIPtin   insulin glargine 100 UNIT/ML injection Commonly known as:  LANTUS   pravastatin 40 MG tablet Commonly known as:  PRAVACHOL     TAKE these medications   aspirin 81 MG EC tablet Take 1 tablet (81 mg total) by mouth daily. Start taking on:  08/07/2018   clopidogrel 75 MG tablet Commonly known as:  PLAVIX Take 1 tablet (75 mg total) by mouth daily. Start taking on:  08/07/2018   fenofibrate 160 MG tablet Take 1 tablet (160 mg total) by mouth daily. Start taking on:  08/07/2018   Insulin Isophane & Regular Human (70-30) 100 UNIT/ML PEN Commonly known as:  HUMULIN 70/30 MIX 20 units in the morning, 10 units in evening. With meals.   lisinopril 5 MG tablet Commonly known as:  PRINIVIL,ZESTRIL Take 1 tablet (5 mg total) by mouth  daily. Start taking on:  08/07/2018   metFORMIN 500 MG tablet Commonly known as:  GLUCOPHAGE Take 1 tablet (500 mg total) by mouth 2 (two) times daily with a meal.   rosuvastatin 20 MG tablet Commonly known as:  CRESTOR Take 1 tablet (20 mg total) by mouth daily at 6 PM.            Durable Medical Equipment  (From admission, onward)         Start     Ordered   08/06/18 1210  For home use only DME 3 n 1  Once     08/06/18 1209   08/06/18 1140  For home use only DME lightweight manual wheelchair with seat cushion  Once    Comments:  Patient suffers from recent stroke which impairs their ability to perform daily activities like bathing and toileting in the home.  A walker will not resolve  issue with performing activities of daily living. A wheelchair will allow patient to safely  perform daily activities. Patient is not able to propel themselves in the home using a standard weight wheelchair due to arm weakness. Patient can self propel in the lightweight wheelchair.  Accessories: elevating leg rests (ELRs), wheel locks, extensions and anti-tippers.   08/06/18 1142         Discharge Instructions: Please refer to Patient Instructions section of EMR for full details.  Patient was counseled important signs and symptoms that should prompt return to medical care, changes in medications, dietary instructions, activity restrictions, and follow up appointments.   Follow-Up Appointments: No future appointments.   Melene Plan, MD 08/06/2018, 10:34 AM PGY-1, [facility]

## 2018-08-06 NOTE — Progress Notes (Signed)
    Durable Medical Equipment  (From admission, onward)         Start     Ordered   08/06/18 1140  For home use only DME lightweight manual wheelchair with seat cushion  Once    Comments:  Patient suffers from recent stroke which impairs their ability to perform daily activities like bathing and toileting in the home.  A walker will not resolve  issue with performing activities of daily living. A wheelchair will allow patient to safely perform daily activities. Patient is not able to propel themselves in the home using a standard weight wheelchair due to arm weakness. Patient can self propel in the lightweight wheelchair.  Accessories: elevating leg rests (ELRs), wheel locks, extensions and anti-tippers.   08/06/18 1142          Genia Hotter, M.D. 08/06/2018, 11:47 AM PGY-1, [facility]

## 2018-08-06 NOTE — Plan of Care (Signed)
  Problem: Education: Goal: Knowledge of disease or condition will improve 08/06/2018 1731 by Emmaline Life, RN Outcome: Completed/Met 08/06/2018 0925 by Emmaline Life, RN Outcome: Progressing Goal: Knowledge of secondary prevention will improve Outcome: Completed/Met Goal: Knowledge of patient specific risk factors addressed and post discharge goals established will improve Outcome: Completed/Met   Problem: Coping: Goal: Will verbalize positive feelings about self 08/06/2018 1731 by Emmaline Life, RN Outcome: Completed/Met 08/06/2018 0925 by Emmaline Life, RN Outcome: Progressing Goal: Will identify appropriate support needs Outcome: Completed/Met   Problem: Health Behavior/Discharge Planning: Goal: Ability to manage health-related needs will improve Outcome: Completed/Met   Problem: Self-Care: Goal: Ability to participate in self-care as condition permits will improve 08/06/2018 1731 by Emmaline Life, RN Outcome: Completed/Met 08/06/2018 0925 by Emmaline Life, RN Outcome: Progressing Goal: Verbalization of feelings and concerns over difficulty with self-care will improve Outcome: Completed/Met Goal: Ability to communicate needs accurately will improve Outcome: Completed/Met   Problem: Nutrition: Goal: Risk of aspiration will decrease Outcome: Completed/Met   Problem: Ischemic Stroke/TIA Tissue Perfusion: Goal: Complications of ischemic stroke/TIA will be minimized Outcome: Completed/Met   Problem: Education: Goal: Knowledge of General Education information will improve Description Including pain rating scale, medication(s)/side effects and non-pharmacologic comfort measures Outcome: Completed/Met   Problem: Health Behavior/Discharge Planning: Goal: Ability to manage health-related needs will improve Outcome: Completed/Met   Problem: Clinical Measurements: Goal: Ability to maintain clinical measurements within normal limits will  improve Outcome: Completed/Met Goal: Will remain free from infection Outcome: Completed/Met Goal: Diagnostic test results will improve Outcome: Completed/Met Goal: Respiratory complications will improve Outcome: Completed/Met Goal: Cardiovascular complication will be avoided Outcome: Completed/Met   Problem: Activity: Goal: Risk for activity intolerance will decrease Outcome: Completed/Met   Problem: Nutrition: Goal: Adequate nutrition will be maintained Outcome: Completed/Met   Problem: Coping: Goal: Level of anxiety will decrease Outcome: Completed/Met   Problem: Elimination: Goal: Will not experience complications related to bowel motility Outcome: Completed/Met Goal: Will not experience complications related to urinary retention Outcome: Completed/Met   Problem: Pain Managment: Goal: General experience of comfort will improve Outcome: Completed/Met   Problem: Safety: Goal: Ability to remain free from injury will improve Outcome: Completed/Met   Problem: Skin Integrity: Goal: Risk for impaired skin integrity will decrease Outcome: Completed/Met

## 2018-08-06 NOTE — Progress Notes (Signed)
Discharge instructions, RX's and follow up appts explained and provided to patient and c/g verbalized understanding. Patient has outpatient PT/OT set up in [place]. Patient left floor via wheelchair accompanied by staff.  Ahna Konkle, Kae Heller, RN

## 2018-08-06 NOTE — Progress Notes (Addendum)
CM met with the patient to go over f/u care post hospitalization. Pt wants to continue with Dr Bernita Buffy even though he will be in [place], [place]. He states his mother will be able to get him to his appointments.  CM called [facility] the outpatient rehab that patients mother requested and they can see the patient for PT/OT and they contract out for ST. All orders faxed to: [phone]. Pt with orders for wheelchair and 3 in 1. CM asked [facility] to see if patient would qualify for charity. CM heard back that patient has too many assets and does not qualify. CM informed the patient and he wants CM to discuss with his mother about renting the wheelchair and buying the 3 in 1.  CM following for his d/c medications to see if patient will need a [place] letter.  1630: family for patient has arrived. Pts mother would like to rent the wheelchair and purchase the 3 in 1 through a DME company in [place], [place]. CM provided her the orders for the DME. They also feel their church will have a wheelchair he can use in the interim. They will pick this up on the way to mothers home. CM provided the family with a [place] letter to assist with the cost of his d/c medications. CM also provided them a list of the pharmacies that honor the [facility]. Family to stop by the patients home to pick up his glucose meter, strips and lancets.

## 2018-08-06 NOTE — Progress Notes (Addendum)
Inpatient Diabetes Program Recommendations  AACE/ADA: New Consensus Statement on Inpatient Glycemic Control (2015)  Target Ranges:  Prepandial:   less than 140 mg/dL      Peak postprandial:   less than 180 mg/dL (1-2 hours)      Critically ill patients:  140 - 180 mg/dL   Lab Results  Component Value Date   GLUCAP 236 (H) 08/06/2018   HGBA1C 12.3 (H) 08/03/2018    Review of Glycemic Control Results for Victor Moreno, Victor Moreno (MRN [id number]) as of 08/06/2018 09:43  Ref. Range 08/05/2018 11:24 08/05/2018 16:49 08/05/2018 21:14 08/06/2018 06:05  Glucose-Capillary Latest Ref Range: 70 - 99 mg/dL 800 (H) 349 (H) 179 (H) 236 (H)   Diabetes history: Type 2 DM Outpatient Diabetes medications: Lantus 10 units QD, Glyxambi 25-5 mg QD Current orders for Inpatient glycemic control: Lantus 20 units QHS, Novolog 7 units TID, Novolog 0-9 units TID, Novolog 0-5 units QHS  Inpatient Diabetes Program Recommendations:    Noted change in discharge plan.  Per progress note from 9/9:  "Patient was not taking medications for diabetes for last 3 months, as patient lost job and subsequently lost insurance. He has co-pay cards that only work with insurance (as this was his medication assistance). The plan was to resume insurance status on the 25th, but that is in the event he is able to work. Patient does not know what to do in the event he cannot work by the 25th." My recommendation until patient has insurance and appropriate follow up, would be Novolin 70/30 at discharge. Consider 70/30 16 units BID and have patient follow up with PCP for adjustments.   If to remain inpatient:  - Consider increasing meal coverage to Novolog 10 units TID (assuming that patient consumes >50% of meal).  - Consider increasing Lantus to 24 units QHS.  Thanks, Lujean Rave, MSN, RNC-OB Diabetes Coordinator [phone] (8a-5p)  Spoke with patient again to confirm his preference for outpatient insulin. Patient prefers insulin pens.  Can purchase over the counter at [facility] for $42 for 5 pens [phone]). Patient educated on Novolin 70/30, differences between short acting vs intermediate acting, reiterated need to eat with injections, stressed the need to check blood sugars at least 2 times per day and to follow up with a PCP. Patient verbalized understanding and had no further questions.  Discussed plan with Harvin Hazel, CM  And Dr Leveda Anna at bedside. LM

## 2018-09-18 ENCOUNTER — Ambulatory Visit: Payer: Self-pay | Admitting: Adult Health

## 2018-09-18 ENCOUNTER — Encounter: Payer: Self-pay | Admitting: Adult Health

## 2018-09-18 VITALS — BP 112/66 | HR 70 | Ht 70.0 in | Wt 180.4 lb

## 2018-09-18 DIAGNOSIS — Z794 Long term (current) use of insulin: Secondary | ICD-10-CM

## 2018-09-18 DIAGNOSIS — I639 Cerebral infarction, unspecified: Secondary | ICD-10-CM

## 2018-09-18 DIAGNOSIS — E119 Type 2 diabetes mellitus without complications: Secondary | ICD-10-CM

## 2018-09-18 DIAGNOSIS — E785 Hyperlipidemia, unspecified: Secondary | ICD-10-CM

## 2018-09-18 DIAGNOSIS — I10 Essential (primary) hypertension: Secondary | ICD-10-CM

## 2018-09-18 MED ORDER — GABAPENTIN 300 MG PO CAPS: 300.0000 mg | ORAL_CAPSULE | Freq: Two times a day (BID) | ORAL | 3 refills | Status: AC

## 2018-09-18 NOTE — Progress Notes (Signed)
I agree with the above plan 

## 2018-09-18 NOTE — Progress Notes (Signed)
[facility] [address] [place]. [place] [address] [phone]       OFFICE FOLLOW UP NOTE  [name] Date of Birth:  1961-11-26 Medical Record Number:  [id number]   Reason for Referral:  hospital stroke follow up  CHIEF COMPLAINT:  Chief Complaint  Patient presents with  . 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 History obtained from patient, mother and chart review. Reviewed all radiology images and labs personally.  [name] is a 57 y.o. male with history of DM and Hld who presented with right sided weakness. He did not receive IV t-PA due to late presentation.  CT head reviewed and was negative for acute abnormality.  MRI brain reviewed and showed 8 mm focus of acute/early subacute infarction within the left posterior limb of internal capsule along with multiple very small chronic infarctions which are present bilateral frontal periventricular white matter.  MRA head showed moderate bilateral PT stenosis.  CTA head and neck showed moderate stenosis origin of the vertebral artery bilaterally, mild to moderate stenosis distal ACA bilaterally and moderate stenosis P2 segment bilaterally.  2D echo showed an EF of 60 to 65% without cardiac source of embolus identified.  LDL unable to be calculated due to elevated triglycerides at 1356 and his patient was not on statin PTA recommended Lipitor 40 mg daily with fenofibrate 160 mg daily with recommendation of close PCP follow-up and recommended to consider PCSK9 inhibitors if TG continues to be elevated..  A1c 12.3 and recommended close PCP follow-up for uncontrolled DM.  HTN stable during admission but on the high side and recommended long-term BP  goal normotensive range.  It was determined that recent infarct was likely secondary to small vessel disease given uncontrolled risk factors.  Recommended outpatient PT/OT due to continued right facial droop and right hemiparesis and was discharged home in stable condition.  Patient is being seen today for hospital follow-up and is accompanied by his mother.  He states he continues to have weakness in his right hand and has only made "some improvement" but is frustrated with his lack of progress.  He continues to participate in PT/OT.  He has continued to take both aspirin and Plavix as he and his mother were unsure for how long to continue but he denies any bleeding or bruising.  He was discharged on Crestor 20 mg along with fenofibrate without any myalgias.  He has not had repeat blood work done but will be having lab work done tomorrow with his PCP.  He does monitor glucose levels at home which he states have been stable.  Blood pressure today satisfactory 112/66.  He does endorse burning and radiating sensation on his right leg along with history of neuropathy in bilateral distal extremities.  Denies OSA symptoms such as snoring, daytime fatigue, insomnia or headaches.  No further concerns at this time.  Denies new or worsening stroke/TIA symptoms.   ROS:   14 system review of systems performed and negative with exception of weight loss and weakness  PMH:  Past Medical History:  Diagnosis Date  . Diabetes mellitus without complication (HCC)   . High cholesterol   . Stroke (  HCC)     PSH: History reviewed. No pertinent surgical history.  Social History:  Social History   Socioeconomic History  . Marital status: Single    Spouse name: Not on file  . Number of children: Not on file  . Years of education: Not on file  . Highest education level: Not on file  Occupational History  . Not on file  Social Needs  . Financial resource strain: Not on file  . Food insecurity:    Worry: Not on  file    Inability: Not on file  . Transportation needs:    Medical: Not on file    Non-medical: Not on file  Tobacco Use  . Smoking status: Never Smoker  . Smokeless tobacco: Never Used  Substance and Sexual Activity  . Alcohol use: Not Currently  . Drug use: Never  . Sexual activity: Not on file  Lifestyle  . Physical activity:    Days per week: Not on file    Minutes per session: Not on file  . Stress: Not on file  Relationships  . Social connections:    Talks on phone: Not on file    Gets together: Not on file    Attends religious service: Not on file    Active member of club or organization: Not on file    Attends meetings of clubs or organizations: Not on file    Relationship status: Not on file  . Intimate partner violence:    Fear of current or ex partner: Not on file    Emotionally abused: Not on file    Physically abused: Not on file    Forced sexual activity: Not on file  Other Topics Concern  . Not on file  Social History Narrative    He is worked at [employer] for the last 2 months.  He lives alone at home.  In his return he watches TV, does yard work, enjoys Museum/gallery curator music, and working on his computer.  Never smoker, only drinks a few beers a year, does not use illicit drugs    Family History:  Family History  Problem Relation Age of Onset  . Stroke Paternal Grandfather     Medications:   Current Outpatient Medications on File Prior to Visit  Medication Sig Dispense Refill  . aspirin EC 81 MG EC tablet Take 1 tablet (81 mg total) by mouth daily. 30 tablet 0  . clopidogrel (PLAVIX) 75 MG tablet Take 1 tablet (75 mg total) by mouth daily. 30 tablet 0  . fenofibrate 160 MG tablet Take 1 tablet (160 mg total) by mouth daily. 30 tablet 0  . Insulin Isophane & Regular Human (HUMULIN 70/30 MIX) (70-30) 100 UNIT/ML PEN 20 units in the morning, 10 units in evening. With meals. 15 mL 0  . lisinopril (PRINIVIL,ZESTRIL) 5 MG tablet Take 1 tablet (5 mg total) by mouth  daily. 30 tablet 0  . metFORMIN (GLUCOPHAGE) 500 MG tablet Take 1 tablet (500 mg total) by mouth 2 (two) times daily with a meal. 60 tablet 0  . rosuvastatin (CRESTOR) 20 MG tablet Take 1 tablet (20 mg total) by mouth daily at 6 PM. 30 tablet 0   No current facility-administered medications on file prior to visit.     Allergies:   Allergies  Allergen Reactions  . Bee Venom Anaphylaxis     Physical Exam  Vitals:   09/18/18 1435  BP: 112/66  Pulse: 70  Weight: 180 lb 6.4 oz (81.8 kg)  Height: 5\' 10"  (1.778 m)   Body mass index is 25.88 kg/m. No exam data present  General: well developed, well nourished, pleasant middle-aged Caucasian male, seated, in no evident distress Head: head normocephalic and atraumatic.   Neck: supple with no carotid or supraclavicular bruits Cardiovascular: regular rate and rhythm, no murmurs Musculoskeletal: no deformity Skin:  no rash/petichiae Vascular:  Normal pulses all extremities  Neurologic Exam Mental Status: Awake and fully alert. Oriented to place and time. Recent and remote memory intact. Attention span, concentration and fund of knowledge appropriate. Mood and affect appropriate.  Cranial Nerves: Fundoscopic exam reveals sharp disc margins. Pupils equal, briskly reactive to light. Extraocular movements full without nystagmus. Visual fields full to confrontation. Hearing intact. Facial sensation intact. Face, tongue, palate moves normally and symmetrically.  Motor: Normal bulk and tone.  RUE: 4+/5 tricep and weak grip strength; RLE: 4+/5 hip flexor; full strength left upper and lower extremity Sensory.:  Decreased sensation bilateral lower extremities from history of diabetic neuropathy Coordination: Rapid alternating movements normal in right hand. Finger-to-nose and heel-to-shin performed accurately bilaterally. Orbits left arm over right arm. Decreased right finger dexterity.  Gait and Station: Arises from chair without difficulty. Stance  is normal. Gait demonstrates normal stride length and balance . Able to heel, toe and tandem walk without difficulty.  Reflexes: 1+ and symmetric. Toes downgoing.    NIHSS  0 Modified Rankin  2    Diagnostic Data (Labs, Imaging, Testing)  Ct Angio Head W Or Wo Contrast Ct Angio Neck W Or Wo Contrast 08/02/2018 IMPRESSION:  1. Negative for emergent large vessel occlusion  2. Moderate stenosis origin of the vertebral arteries bilaterally. Atherosclerotic disease of the carotid bifurcation left greater than right without hemodynamically significant stenosis  3. Mild to moderate stenosis distal ACA bilaterally. Moderate stenosis P2 segment bilaterally.    Ct Head Wo Contrast 08/03/2018 IMPRESSION:  The known infarct in the posterior limb of the left internal capsule seen on recent MRI is more conspicuous on today's CT scan than the August 02, 2018 CT scan. This small infarct appears roughly stable in size compared to the comparison MRI. Chronic white matter changes. No other acute abnormalities identified.    Mr Victor Moreno Head Wo Contrast 08/02/2018 IMPRESSION:  1. 8 mm focus of acute/early subacute infarction within the left posterior limb of internal capsule. No associated hemorrhage or mass effect.  2. Multiple very small chronic infarctions are present in bilateral frontal periventricular white matter. Mild volume loss of the brain.  3. Patent anterior and posterior intracranial circulation. No large vessel occlusion, aneurysm, or high-grade stenosis.  4. Moderate bilateral P2 stenosis. Bilateral ICA atherosclerosis with mild lumen irregularity, no high-grade stenosis.    Ct Head Code Stroke Wo Contrast 08/02/2018 IMPRESSION:  1. No acute abnormality  2. ASPECTS is 10    Transthoracic Echocardiogram  08/03/2018 Study Conclusions - Left ventricle: The cavity size was normal. There was moderate concentric hypertrophy. Systolic function was normal. The estimated ejection  fraction was in the range of 60% to 65%. Wall motion was normal; there were no regional wall motion abnormalities. Doppler parameters are consistent with abnormal left ventricular relaxation (grade 1 diastolic dysfunction). - Aortic valve: There was no regurgitation. - Mitral valve: Calcified annulus. Mildly thickened leaflets . - Left atrium: The atrium was normal in size. - Right ventricle: Systolic function was normal. - Tricuspid valve: There was mild regurgitation. - Pulmonic valve: There was no regurgitation. - Pulmonary arteries: Systolic pressure was within the  normal range. - Inferior vena cava: The vessel was normal in size. - Pericardium, extracardiac: There was no pericardial effusion. Impressions: - No cardiac source of emboli was indentified.    ASSESSMENT: Victor Moreno is a 57 y.o. 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 Vascular risk factors include uncontrolled HLD, DM and HTN. Patient returns today for hospital follow up and continued to have right fine motor weakness but otherwise has been stable.     PLAN: -Continue aspirin 81 mg daily  and crestor and fenofibrate for secondary stroke prevention  -start gabapentin 300mg  BID for nerve pain. If continues to have nerve pain at this dose, we will consider increasing if needed - once stable dose, request PCP to continue to prescribe  -F/u with PCP regarding your HLD, HTN and DM management -continue to participate in PT/OT for continued right hand weakness -continue to monitor BP at home -advised to continue to stay active and maintain a healthy diet -Maintain strict control of hypertension with blood pressure goal below 130/90, diabetes with hemoglobin A1c goal below 6.5% and cholesterol with LDL cholesterol (bad cholesterol) goal below 70 mg/dL. I also advised the patient to eat a healthy diet with  plenty of whole grains, cereals, fruits and vegetables, exercise regularly and maintain ideal body weight.  Follow up as needed as requested by mother due to lack of insurance. Patient plans on following up with PCP for continued management.   Greater than 50% of time during this 25 minute visit was spent on counseling,explanation of diagnosis of left PLIC infarct, reviewing risk factor management of HLD, HTN and DM, planning of further management, discussion with patient and family and coordination of care    George Hugh, AGNP-BC  [facility] [address] [place], [place] [address]  Phone [phone] Fax [phone] Note: This document was prepared with digital dictation and possible smart phrase technology. Any transcriptional errors that result from this process are unintentional.

## 2018-09-18 NOTE — Patient Instructions (Signed)
Continue aspirin 81 mg daily  and crestor and zetia  for secondary stroke prevention  Start gabapentin 300mg  twice a day for nerve pain - if you are unable to tolerate, please let us know and we can change to evening dose only. If you are tolerating well but continue to have nerve pain, we will increase dose at that time. Once we have a stable dose, continue to follow with your primary for management  Continue to follow up with PCP regarding cholesterol, blood pressure and diabetes management   Continue to stay active and participate in therapy   Continue to monitor blood pressure at home  Maintain strict control of hypertension with blood pressure goal below 130/90, diabetes with hemoglobin A1c goal below 6.5% and cholesterol with LDL cholesterol (bad cholesterol) goal below 70 mg/dL. I also advised the patient to eat a healthy diet with plenty of whole grains, cereals, fruits and vegetables, exercise regularly and maintain ideal body weight.  Followup in the future with me as needed or call earlier if needed       Thank you for coming to see [place] at [facility]. I hope we have been able to provide you high quality care today.  You may receive a patient satisfaction survey over the next few weeks. We would appreciate your feedback and comments so that we may continue to improve ourselves and the health of our patients.

## 2019-02-13 IMAGING — MR MR MRA HEAD W/O CM
10 of 14 series · 32 of 48 positions shown · non-contrast
Comparison: 08/02/2018 CT head and CTA of the head.

CLINICAL DATA: 57 y/o M; speech difficulty and right-sided
weakness.

EXAM:
MRI HEAD WITHOUT CONTRAST
MRA HEAD WITHOUT CONTRAST
TECHNIQUE: Multiplanar, multiecho pulse sequences of the brain and surrounding
structures were obtained without intravenous contrast. Angiographic
images of the head were obtained using MRA technique without
contrast.

[Series 5: DWI · axial · 4.0mm · 0.92mm/px · z∈[-53,+103]mm · 5 of 80 slices shown (1 of 4)]
[im 1/80]
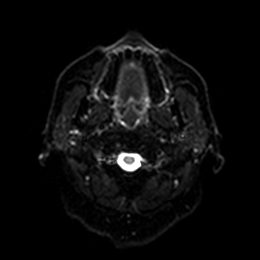
[im 20/80]
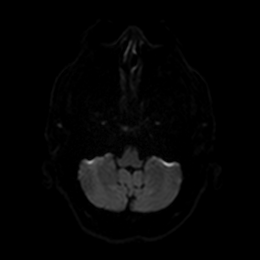
[im 40/80]
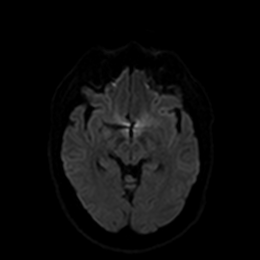
[im 60/80]
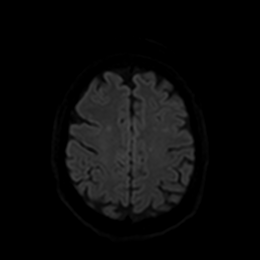
[im 80/80]
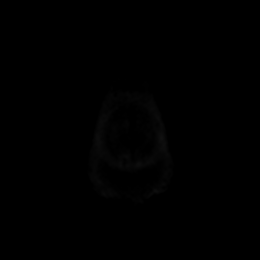

[Series 6: DWI · axial · 4.0mm · 0.92mm/px · z∈[-53,+103]mm · 3 of 40 slices shown (2 of 4)]
[im 1/40]
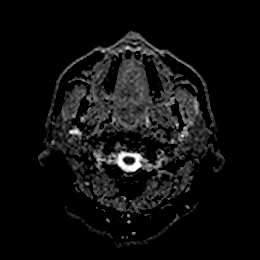
[im 20/40]
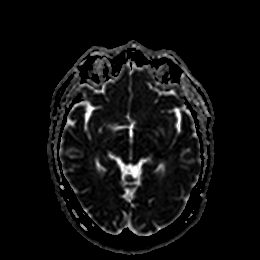
[im 40/40]
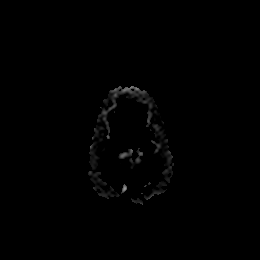

[Series 11: T2 · axial · 5.0mm · 0.75mm/px · z∈[-41,+102]mm · 2 of 25 slices shown (1 of 2)]
[im 1/25]
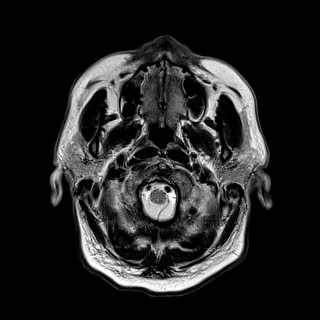
[im 25/25]
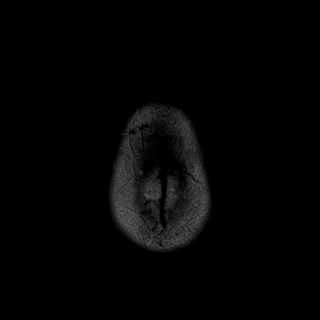

[Series 12: FLAIR · axial · 5.0mm · 0.47mm/px · z∈[-40,+103]mm · 2 of 25 slices shown]
[im 1/25]
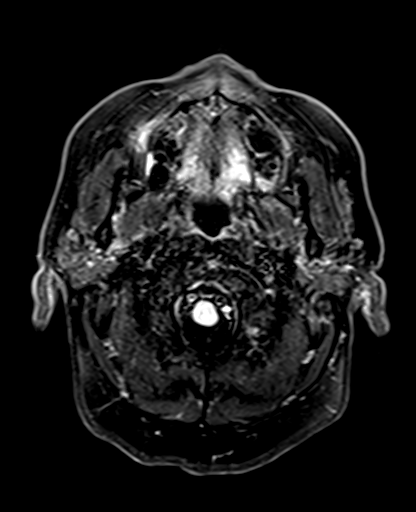
[im 25/25]
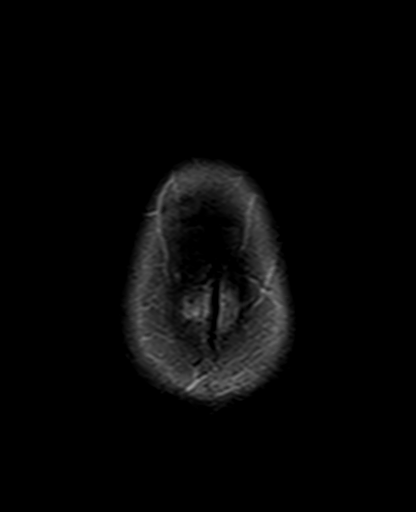

[Series 13: swi_images · axial · 3.0mm · 0.94mm/px · z∈[-44,+108]mm · 4 of 52 slices shown]
[im 1/52]
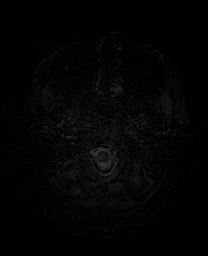
[im 18/52]
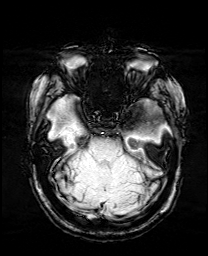
[im 35/52]
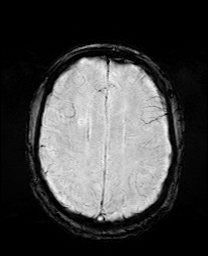
[im 52/52]
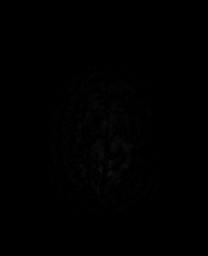

[Series 14: mip_images(sw) · axial · 24.0mm · 0.94mm/px · z∈[-34,+98]mm · 4 of 45 slices shown]
[im 1/45]
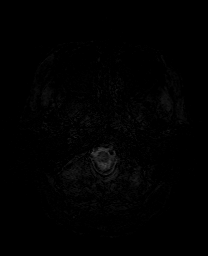
[im 15/45]
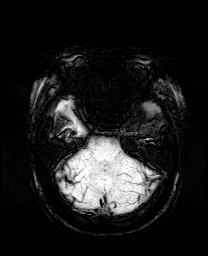
[im 30/45]
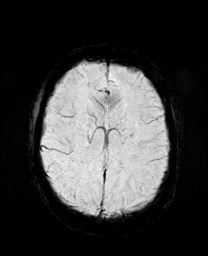
[im 45/45]
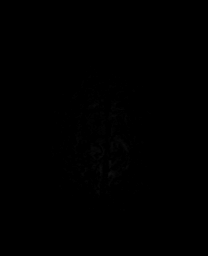

[Series 15: DWI · coronal · 4.0mm · 0.88mm/px · 5 of 64 slices shown (3 of 4)]
[im 1/64]
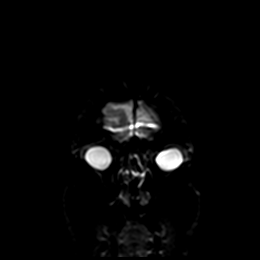
[im 16/64]
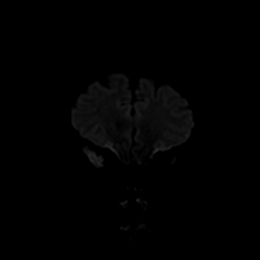
[im 32/64]
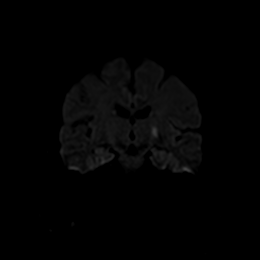
[im 48/64]
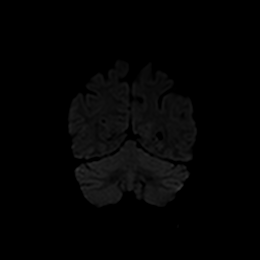
[im 64/64]
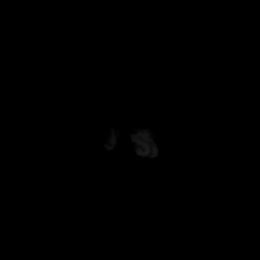

[Series 16: DWI · coronal · 4.0mm · 0.88mm/px · 3 of 32 slices shown (4 of 4)]
[im 1/32]
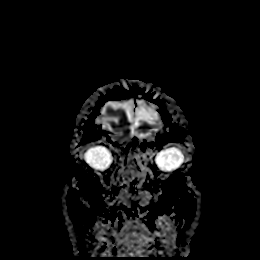
[im 16/32]
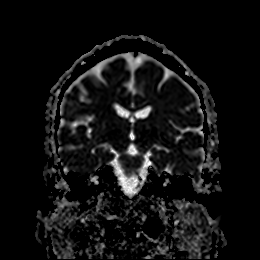
[im 32/32]
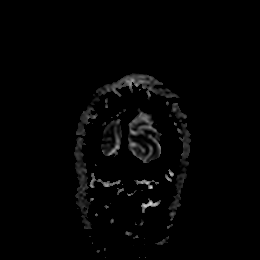

[Series 17: T1 · sagittal · 5.0mm · 0.75mm/px · 2 of 23 slices shown]
[im 1/23]
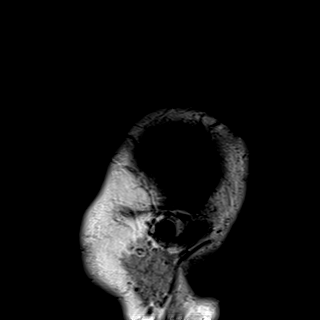
[im 23/23]
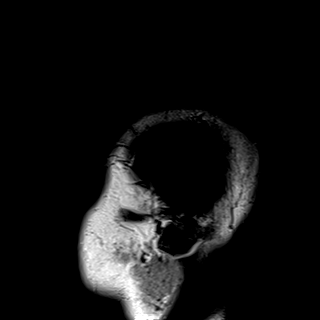

[Series 19: T2 · coronal · 5.0mm · 0.34mm/px · 2 of 27 slices shown (2 of 2)]
[im 1/27]
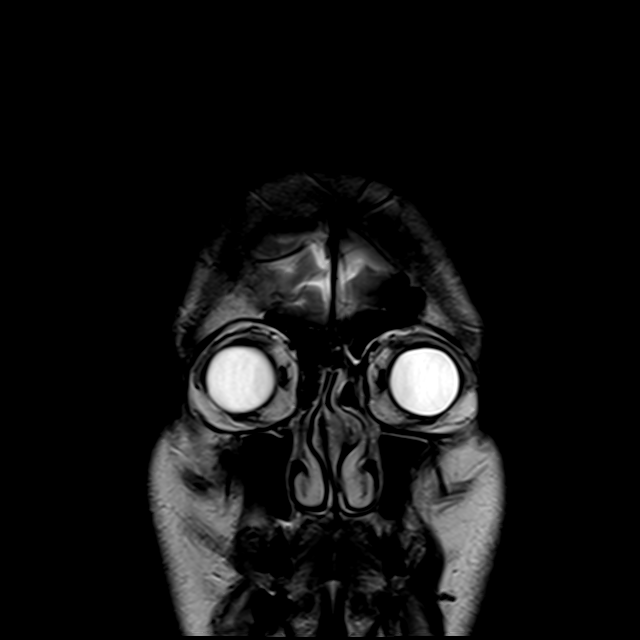
[im 27/27]
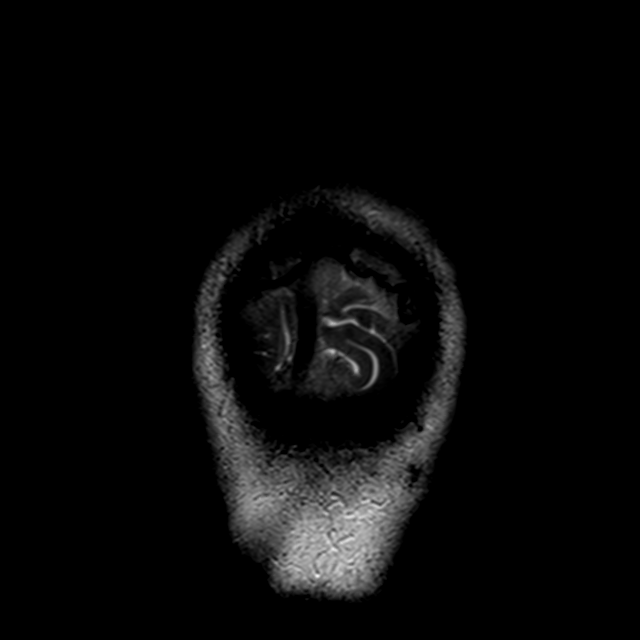

[32 of 48 positions shown; findings below may reference images not displayed]

FINDINGS: MRI HEAD FINDINGS

Brain: 8 mm focus of reduced diffusion within the left posterior
limb of internal capsule compatible with acute/early subacute
infarction. No associated hemorrhage or mass effect.

Very small chronic infarcts are present in bilateral frontal
periventricular white matter and there is mild volume loss of the
brain. No hemorrhage, focal mass effect, extra-axial collection,
hydrocephalus, or herniation.

Vascular: As below.

Skull and upper cervical spine: Normal marrow signal.

Sinuses/Orbits: Mild ethmoid sinus mucosal thickening. No abnormal
signal of the mastoid air cells. Right intra-ocular lens
replacement.

Other: None.

MRA HEAD FINDINGS

Internal carotid arteries: Patent. Petrous apex pneumatization on
the left results in susceptibility artifact partially obscuring the
left vertical and horizontal petrous ICA. Carotid siphon
atherosclerosis with mild lumen irregularity, no high-grade
stenosis.

Anterior cerebral arteries: Patent. Large right A1, large anterior
communicating artery, diminutive left A1, normal variant.

Middle cerebral arteries: Patent.

Anterior communicating artery: Patent.

Posterior communicating arteries:  Patent.

Posterior cerebral arteries: Patent. Moderate bilateral P2 stenosis.

Basilar artery:  Patent.

Vertebral arteries:  Patent.

No evidence of high-grade stenosis, large vessel occlusion, or
aneurysm.
IMPRESSION: 1. 8 mm focus of acute/early subacute infarction within the left
posterior limb of internal capsule. No associated hemorrhage or mass
effect.
2. Multiple very small chronic infarctions are present in bilateral
frontal periventricular white matter. Mild volume loss of the brain.
3. Patent anterior and posterior intracranial circulation. No large
vessel occlusion, aneurysm, or high-grade stenosis.
4. Moderate bilateral P2 stenosis. Bilateral ICA atherosclerosis
with mild lumen irregularity, no high-grade stenosis.

These results will be called to the ordering clinician or
representative by the Radiologist Assistant, and communication
documented in the PACS or zVision Dashboard.

By: Jammie Nivar M.D.

## 2019-02-13 IMAGING — CT CT HEAD CODE STROKE
4 series · 16 of 47 positions shown, 18 images · non-contrast
Comparison: None.

CLINICAL DATA: Code stroke. Speech difficulty. Right-sided
weakness.

EXAM:
CT HEAD WITHOUT CONTRAST
TECHNIQUE: Contiguous axial images were obtained from the base of the skull
through the vertex without intravenous contrast.

[Series 3: head wo · axial · 0.44mm/px · z∈[-110,+15]mm · 7 of 35 slices shown, 9 images]
[im 5/35  brain]
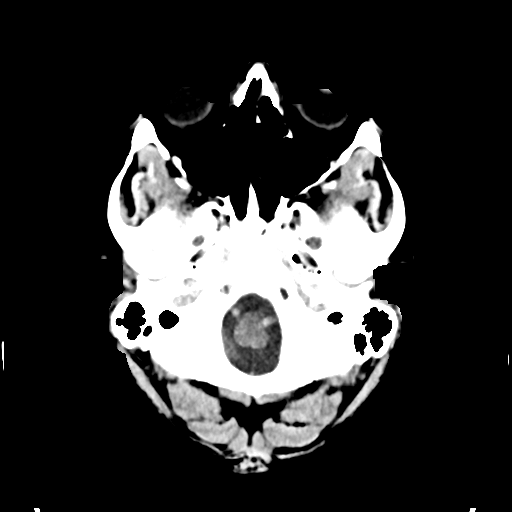
[im 5/35  bone]
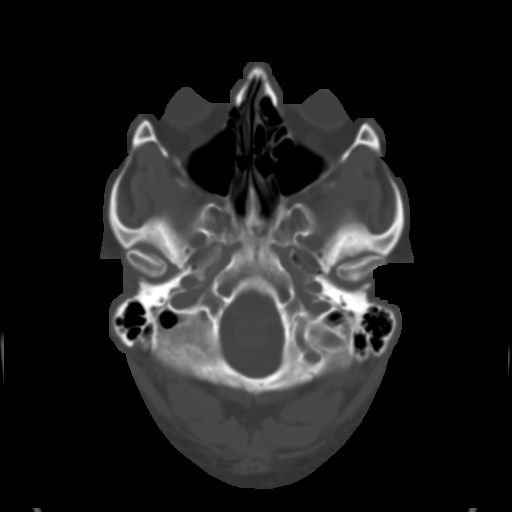
[im 9/35  brain]
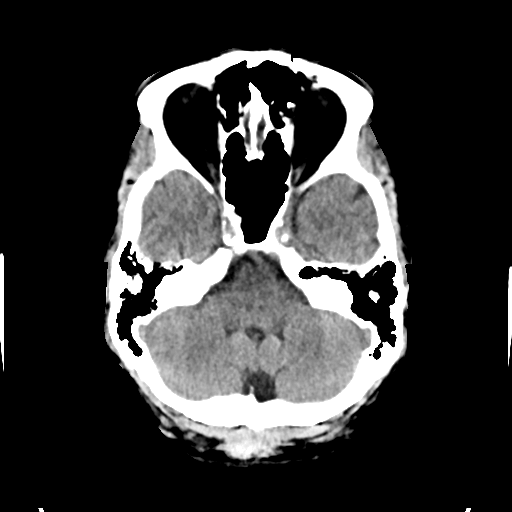
[im 13/35  brain]
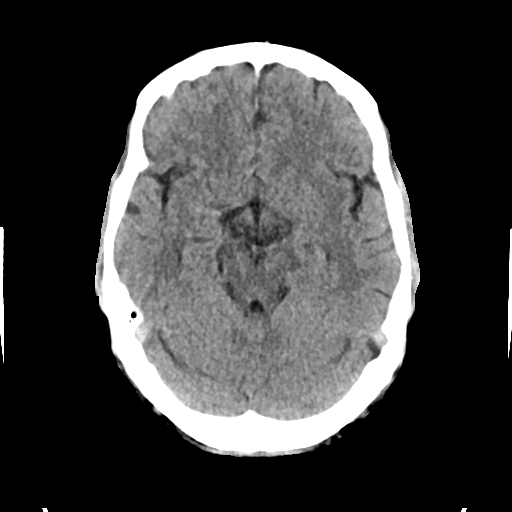
[im 18/35  brain]
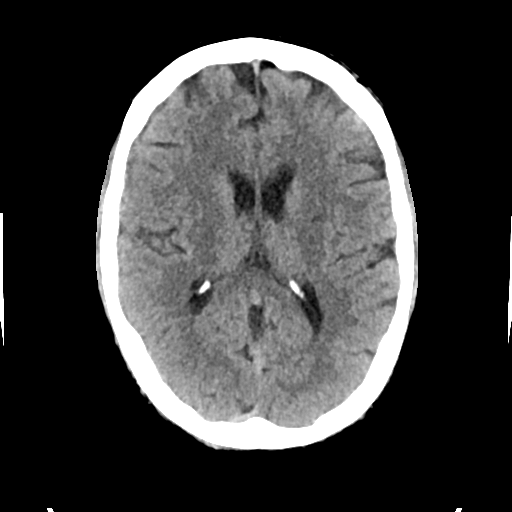
[im 22/35  brain]
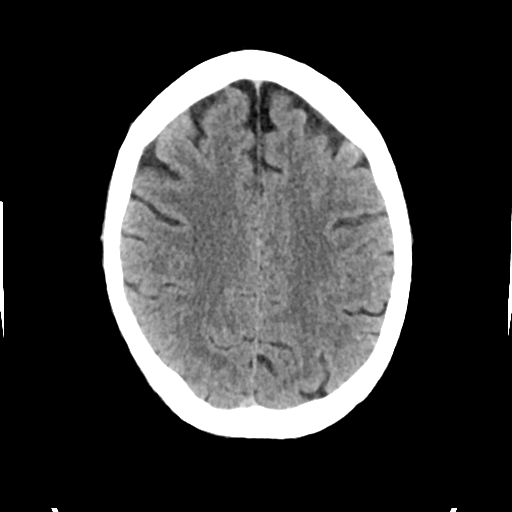
[im 22/35  bone]
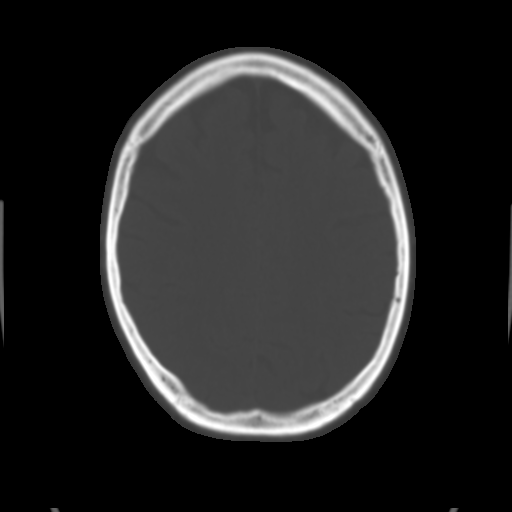
[im 26/35  brain]
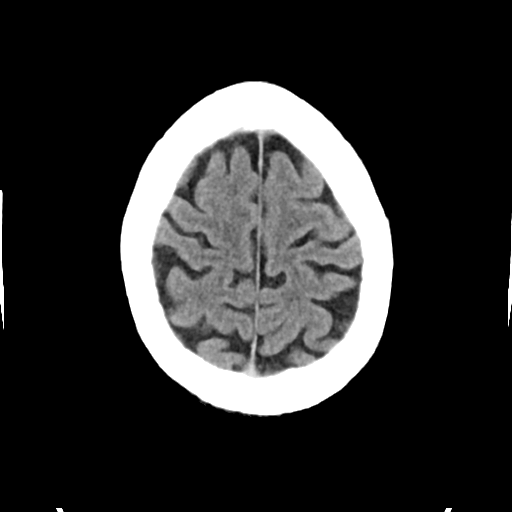
[im 30/35  brain]
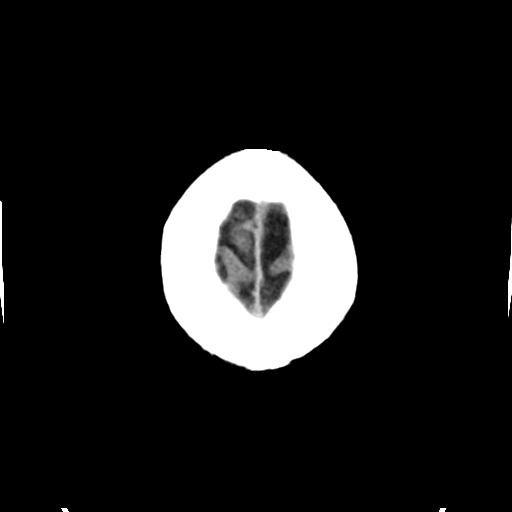

[Series 4: head bone · axial · 0.44mm/px · z∈[-114,-80]mm · 3 of 87 slices shown]
[im 9/87  bone]
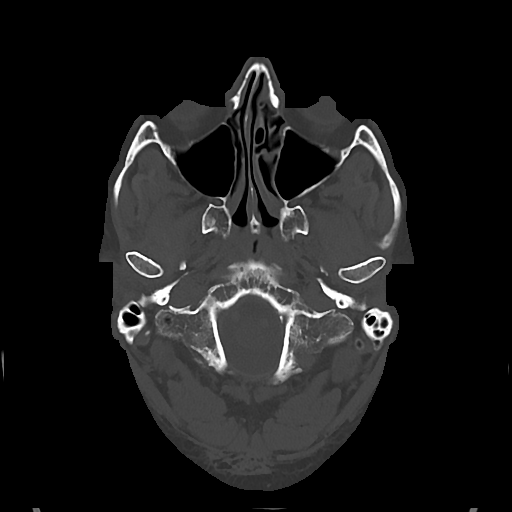
[im 18/87  bone]
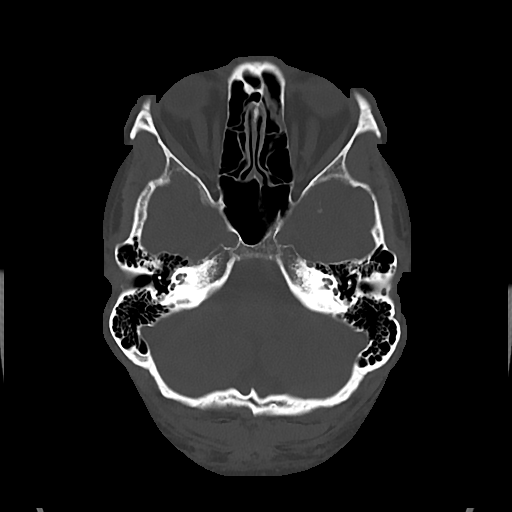
[im 26/87  bone]
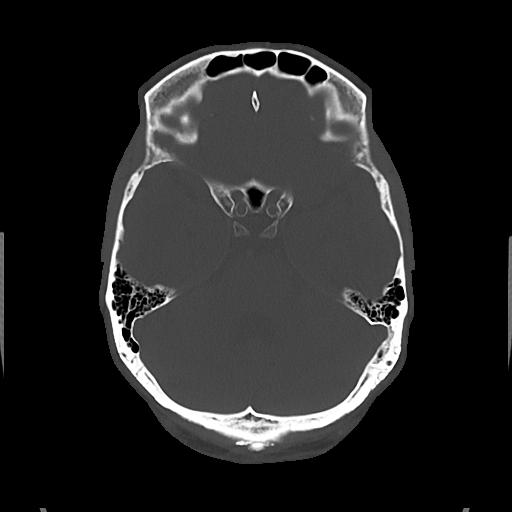

[Series 5: cor soft · coronal · 0.36mm/px · 3 of 76 slices shown]
[im 26/76  brain]
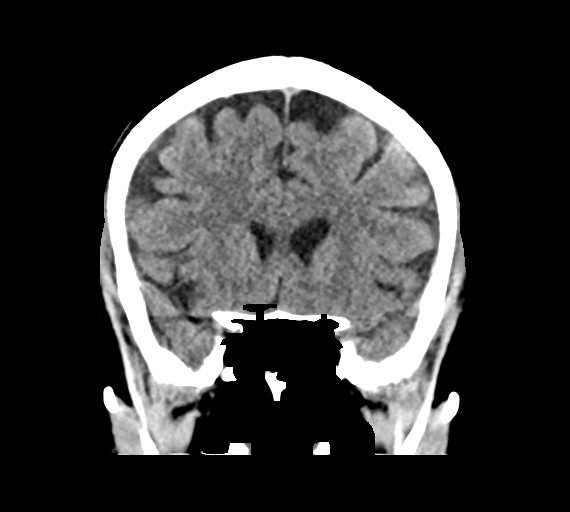
[im 34/76  brain]
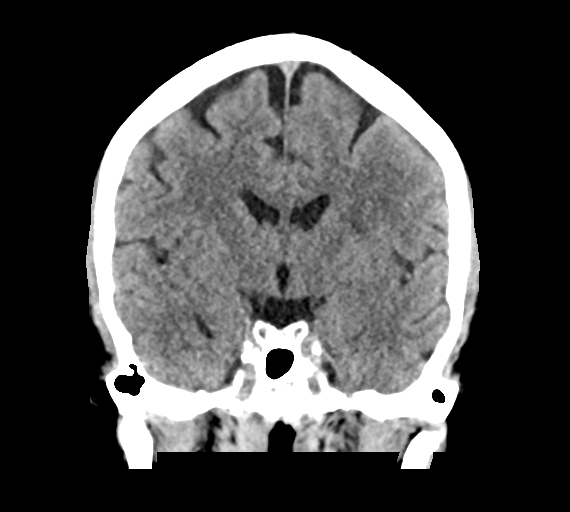
[im 42/76  brain]
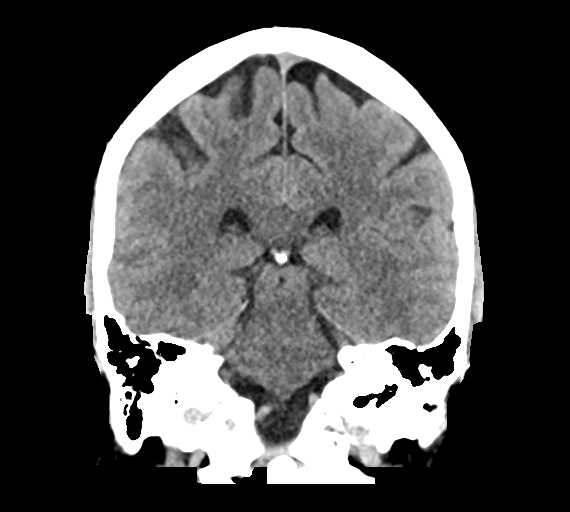

[Series 6: sag soft · sagittal · 0.36mm/px · 3 of 67 slices shown]
[im 23/67  brain]
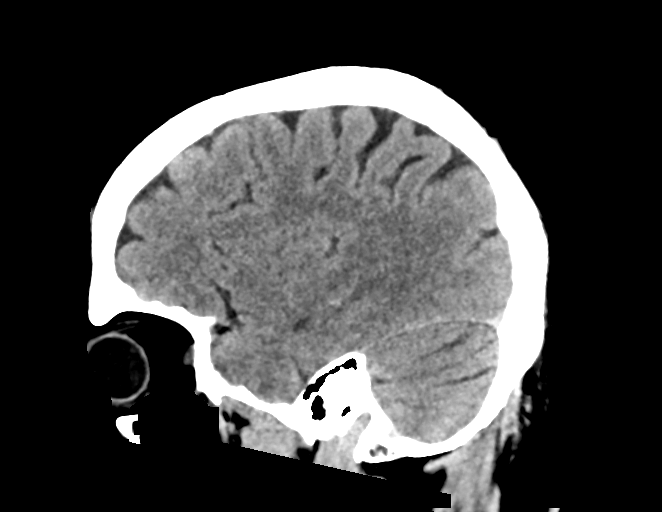
[im 34/67  brain]
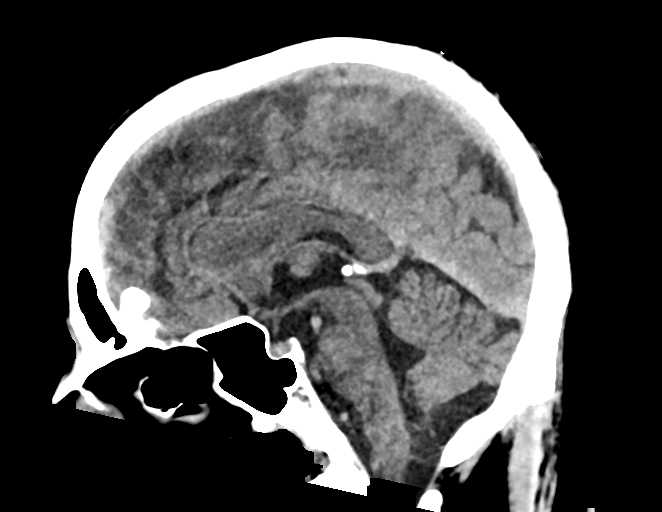
[im 45/67  brain]
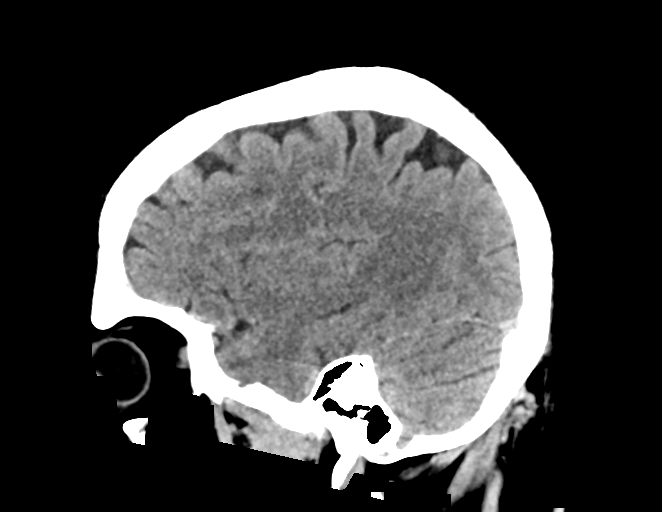

[16 of 47 positions shown; findings below may reference images not displayed]

FINDINGS: Brain: Small hypodensity right frontal white matter consistent with
chronic ischemia. Negative for acute infarct, hemorrhage, mass.
Ventricle size normal.

Vascular: Atherosclerotic calcification of the skull base. Negative
for hyperdense vessel

Skull: Negative

Sinuses/Orbits: Paranasal sinuses clear. Cataract surgery on the
right. No orbital mass.

Other: None

ASPECTS (Alberta Stroke Program Early CT Score)

- Ganglionic level infarction (caudate, lentiform nuclei, internal
capsule, insula, M1-M3 cortex): 7

- Supraganglionic infarction (M4-M6 cortex): 3

Total score (0-10 with 10 being normal): 10
IMPRESSION: 1. No acute abnormality
2. ASPECTS is 10
3. These results were called by telephone at the time of
interpretation on 08/02/2018 at [DATE] to Dr. Kasmi, who
verbally acknowledged these results.
